# Patient Record
Sex: Female | Born: 1945 | Race: White | Hispanic: No | Smoking: Never smoker
Health system: Southern US, Community
[De-identification: ages and names within clinical notes are randomized; demographics above are authoritative.]

## PROBLEM LIST (undated history)

## (undated) DIAGNOSIS — E785 Hyperlipidemia, unspecified: Secondary | ICD-10-CM

## (undated) DIAGNOSIS — K635 Polyp of colon: Secondary | ICD-10-CM

## (undated) DIAGNOSIS — C801 Malignant (primary) neoplasm, unspecified: Secondary | ICD-10-CM

## (undated) DIAGNOSIS — I10 Essential (primary) hypertension: Secondary | ICD-10-CM

## (undated) DIAGNOSIS — K922 Gastrointestinal hemorrhage, unspecified: Secondary | ICD-10-CM

## (undated) DIAGNOSIS — K2970 Gastritis, unspecified, without bleeding: Secondary | ICD-10-CM

## (undated) DIAGNOSIS — Z5189 Encounter for other specified aftercare: Secondary | ICD-10-CM

## (undated) HISTORY — PX: COLONOSCOPY: SHX174

## (undated) HISTORY — PX: CHOLECYSTECTOMY: SHX55

## (undated) HISTORY — PX: TUBAL LIGATION: SHX77

---

## 2005-02-28 ENCOUNTER — Emergency Department: Payer: Self-pay | Admitting: Emergency Medicine

## 2014-04-22 ENCOUNTER — Inpatient Hospital Stay
Admit: 2014-04-22 | Disposition: A | Discharge status: Home / Self Care | Payer: Self-pay | Attending: Internal Medicine | Admitting: Internal Medicine

## 2014-04-22 LAB — COMPREHENSIVE METABOLIC PANEL
AST: 19 U/L
Albumin: 3.5 g/dL
Alkaline Phosphatase: 60 U/L
Anion Gap: 6 — ABNORMAL LOW (ref 7–16)
BUN: 41 mg/dL — AB
Bilirubin,Total: 0.4 mg/dL
CALCIUM: 8.1 mg/dL — AB
CHLORIDE: 108 mmol/L
CO2: 21 mmol/L — AB
Creatinine: 0.77 mg/dL
EGFR (Non-African Amer.): >60
Glucose: 167 mg/dL — ABNORMAL HIGH
POTASSIUM: 4 mmol/L
SGPT (ALT): 13 U/L — ABNORMAL LOW
SODIUM: 135 mmol/L
TOTAL PROTEIN: 6.4 g/dL — AB

## 2014-04-22 LAB — CBC WITH DIFFERENTIAL/PLATELET
BASOS ABS: 0.1 10*3/uL (ref 0.0–0.1)
Basophil %: 0.4 %
EOS ABS: 0 10*3/uL (ref 0.0–0.7)
Eosinophil %: 0.1 %
HCT: 26.7 % — ABNORMAL LOW (ref 35.0–47.0)
HGB: 8.2 g/dL — ABNORMAL LOW (ref 12.0–16.0)
Lymphocyte #: 0.9 10*3/uL — ABNORMAL LOW (ref 1.0–3.6)
Lymphocyte %: 6.9 %
MCH: 26.4 pg (ref 26.0–34.0)
MCHC: 30.7 g/dL — AB (ref 32.0–36.0)
MCV: 86 fL (ref 80–100)
Monocyte #: 0.5 x10 3/mm (ref 0.2–0.9)
Monocyte %: 3.9 %
NEUTROS ABS: 11.1 10*3/uL — AB (ref 1.4–6.5)
NEUTROS PCT: 88.7 %
Platelet: 333 10*3/uL (ref 150–440)
RBC: 3.11 10*6/uL — ABNORMAL LOW (ref 3.80–5.20)
RDW: 15 % — AB (ref 11.5–14.5)
WBC: 12.6 10*3/uL — ABNORMAL HIGH (ref 3.6–11.0)

## 2014-04-22 LAB — IRON AND TIBC
IRON SATURATION: 45.7
IRON: 206 ug/dL — AB
Iron Bind.Cap.(Total): 450 (ref 250–450)
Unbound Iron-Bind.Cap.: 244.4

## 2014-04-22 LAB — PROTIME-INR
INR: 1
Prothrombin Time: 13 secs

## 2014-04-22 LAB — FERRITIN: Ferritin (ARMC): 4 ng/mL — ABNORMAL LOW

## 2014-04-22 LAB — HEMOGLOBIN: HGB: 7.5 g/dL — AB (ref 12.0–16.0)

## 2014-04-22 LAB — TROPONIN I: Troponin-I: <0.03 ng/mL

## 2014-04-23 LAB — CBC WITH DIFFERENTIAL/PLATELET
Basophil #: 0 10*3/uL (ref 0.0–0.1)
Basophil %: 0.4 %
Eosinophil #: 0 10*3/uL (ref 0.0–0.7)
Eosinophil %: 0.1 %
HCT: 21 % — ABNORMAL LOW (ref 35.0–47.0)
HGB: 6.5 g/dL — ABNORMAL LOW (ref 12.0–16.0)
Lymphocyte #: 2 10*3/uL (ref 1.0–3.6)
Lymphocyte %: 19.7 %
MCH: 27 pg (ref 26.0–34.0)
MCHC: 31.1 g/dL — ABNORMAL LOW (ref 32.0–36.0)
MCV: 87 fL (ref 80–100)
MONOS PCT: 7.2 %
Monocyte #: 0.7 x10 3/mm (ref 0.2–0.9)
Neutrophil #: 7.5 10*3/uL — ABNORMAL HIGH (ref 1.4–6.5)
Neutrophil %: 72.6 %
PLATELETS: 239 10*3/uL (ref 150–440)
RBC: 2.42 10*6/uL — ABNORMAL LOW (ref 3.80–5.20)
RDW: 15 % — ABNORMAL HIGH (ref 11.5–14.5)
WBC: 10.3 10*3/uL (ref 3.6–11.0)

## 2014-04-23 LAB — URINALYSIS, COMPLETE
BILIRUBIN, UR: NEGATIVE
Glucose,UR: NEGATIVE mg/dL (ref 0–75)
Ketone: NEGATIVE
NITRITE: NEGATIVE
PH: 5 (ref 4.5–8.0)
Protein: NEGATIVE
Specific Gravity: 1.02 (ref 1.003–1.030)
WBC UR: 6 /HPF (ref 0–5)

## 2014-04-23 LAB — BASIC METABOLIC PANEL
Anion Gap: 6 — ABNORMAL LOW (ref 7–16)
BUN: 35 mg/dL — AB
CHLORIDE: 113 mmol/L — AB
Calcium, Total: 7.7 mg/dL — ABNORMAL LOW
Co2: 21 mmol/L — ABNORMAL LOW
Creatinine: 0.75 mg/dL
EGFR (African American): >60
GLUCOSE: 112 mg/dL — AB
Potassium: 3.9 mmol/L
SODIUM: 140 mmol/L

## 2014-04-23 LAB — HEMOGLOBIN: HGB: 8.3 g/dL — ABNORMAL LOW (ref 12.0–16.0)

## 2014-04-24 LAB — CBC WITH DIFFERENTIAL/PLATELET
Basophil #: 0 10*3/uL (ref 0.0–0.1)
Basophil %: 0.5 %
EOS PCT: 1 %
Eosinophil #: 0.1 10*3/uL (ref 0.0–0.7)
HCT: 27 % — ABNORMAL LOW (ref 35.0–47.0)
HGB: 8.7 g/dL — AB (ref 12.0–16.0)
Lymphocyte #: 1.3 10*3/uL (ref 1.0–3.6)
Lymphocyte %: 16.8 %
MCH: 27.2 pg (ref 26.0–34.0)
MCHC: 32.2 g/dL (ref 32.0–36.0)
MCV: 84 fL (ref 80–100)
MONO ABS: 0.6 x10 3/mm (ref 0.2–0.9)
MONOS PCT: 7.5 %
Neutrophil #: 5.7 10*3/uL (ref 1.4–6.5)
Neutrophil %: 74.2 %
PLATELETS: 199 10*3/uL (ref 150–440)
RBC: 3.2 10*6/uL — ABNORMAL LOW (ref 3.80–5.20)
RDW: 16.4 % — ABNORMAL HIGH (ref 11.5–14.5)
WBC: 7.7 10*3/uL (ref 3.6–11.0)

## 2014-04-25 LAB — HEMOGLOBIN: HGB: 8 g/dL — ABNORMAL LOW (ref 12.0–16.0)

## 2014-04-26 LAB — HEMOGLOBIN: HGB: 9.1 g/dL — ABNORMAL LOW (ref 12.0–16.0)

## 2014-05-21 LAB — SURGICAL PATHOLOGY

## 2014-05-27 NOTE — Discharge Summary (Signed)
PATIENT NAME:  Veronica Moon, Veronica Moon MR#:  [id number] DATE OF BIRTH:  06/01/1945  DATE OF ADMISSION:  04/22/2014 DATE OF DISCHARGE:  04/28/2014  DISCHARGE DIAGNOSES:  1.  Gastrointestinal bleed.  2.  Anemia of blood loss.  CONDITION ON DISCHARGE: Stable.   CODE STATUS: Full code.    MEDICATIONS ON DISCHARGE:  1.  Alprazolam 0.5 mg oral 3 times a day as needed for anxiety.  2.  Pantoprazole 40 mg delayed-release 2 times a day.  3.  Ferrous sulfate 325 mg oral 3 times a day.   DIET ON DISCHARGE: Regular, diet consistency mechanical soft.   ACTIVITY: As tolerated.   TIMEFRAME TO FOLLOWUP: Within 1-2 weeks in GI clinic. Advised to follow with [facility], Dr. Gaylyn Cheers.   HISTORY OF PRESENTING ILLNESS: This is a 69 year old elderly Caucasian female with no significant past medical history presented to hospital secondary to weakness for 2 days and noticed to have a dark stool in the morning. The patient's daughter did mention that she had another episode of melena about 2 weeks ago and since then she had been doing fine, but this morning she had 2 loose bowel movements both of which were very black-colored. She was not iron supplements. She was not taking any BC Powders. She was taking aspirin and takes Advil once in a while. She had nausea and vomiting 4 times on the day of admission and she also had some loose, black color, tarry colored vomiting. No fever or chills. She was admitted with GI bleed for further management in the hospital.   [place]:  1.  Upper GI bleed: She had coffee ground emesis and melena. Denied alcohol or nonsteroidal anti-inflammatory use. Had blood loss anemia with and 2 units of PRBC were given and given iron tablets on discharge. Protonix IV b.i.d. was also given in the hospital and on discharge given Protonix oral. EGD was done by GI, which showed gastritis. Also, did capsule endoscopy, the results were still pending and she need to follow in  GI clinic for that. Colonoscopy was done, which showed some polyps, but no clear source of bleeding. Hemoglobin was stable after that, so we assumed that her bleeding was because of gastritis and she was advised to follow in GI clinic for further management.  2.  Musculoskeletal right shoulder pain: It resolved. She was tolerating the diet fine.   [place]: GI, Dr. Vira Agar.   IMPORTANT LABORATORY RESULTS: WBC count 12.6, hemoglobin 8.2, platelet count is 333,000, MCV is 86. Troponin less than 0.03. Glucose level is 167, creatinine 0.77, sodium 135, and potassium 4 on admission. Hemoglobin was 7.5 on the March 27, was 6.5 on March 28, and after transfusion on the March 29 it was 8.7 and then 9.1 on March 31.   TOTAL TIME SPENT ON THIS DISCHARGE: 40 minutes.    ____________________________ Ceasar Lund Anselm Jungling, MD vgv:bm D: 05/02/2014 22:18:21 ET T: 05/03/2014 07:25:30 ET JOB#: [id number]  cc: Ceasar Lund. Anselm Jungling, MD, <Dictator> Manya Silvas, MD Primary Care Physician Vaughan Basta MD ELECTRONICALLY SIGNED 05/21/2014 23:29

## 2014-05-27 NOTE — Consult Note (Signed)
PATIENT NAME:  Veronica Moon, GREENING MR#:  [id number] DATE OF BIRTH:  1945-12-07  DATE OF CONSULTATION:  04/23/2014  REFERRING PHYSICIAN:   CONSULTING PHYSICIAN:  Manya Silvas, MD  HISTORY OF PRESENT ILLNESS: The patient is a 69 year old white female who has been severely weak for a couple of days and having dark stools this morning. She had an episode of dark stools a couple of weeks ago, had a couple of loose bowel movements. Both looked like dark blood. She had nausea and threw up 4 times yesterday, came to the hospital and initially her hemoglobin was 8.2 yesterday at 3:00 in the afternoon and it was 6.5 at 3:40 in the morning. She has since gotten 2 units of blood. I was asked to see her for GI bleeding.   She has been feeling severely weak in the last few days. She had some falls trying to get back and forth to the bathroom and would lie on the floor for a while until she got enough energy to get back in bed. She could hardly walk across the floor she was so weak.   REVIEW OF SYSTEMS: No fever. Significant weakness. No blurred vision. No asthma or wheezing. No known heart disease. She does have a history of some chest pain, but she has never seen a doctor about chest pain. She has been nauseated and has hematemesis of dark material and passage of dark material. No dysuria or hematuria. Sometimes she has food that gets stuck in her esophagus, especially meat. She has been told she has a large hiatal hernia. She does overall feel a little better since coming into the hospital but still feels very weak.   PAST SURGICAL HISTORY: Gallbladder removal, tubal ligation.   ALLERGIES: No known drug allergies.   HOME MEDICATIONS: Before admission: Aspirin 81 mg a day, Advil p.r.n., multivitamins 1 a day, B12 one  daily.   SOCIAL HISTORY: Lives with her granddaughter. No alcohol or tobacco use. The patient is not a Jehovah's Witness.   PHYSICAL EXAMINATION: GENERAL: Elderly white female who looks  somewhat pale.  VITAL SIGNS: Blood pressure 120/58, pulse 72, respirations 18, temperature 97.9.  HEENT: Sclerae anicteric. Conjunctivae are slightly pale. The nail beds are pale. Tongue is somewhat pale.  CHEST: Clear in the anterior fields.  HEART: No murmurs or gallops that I could hear.  ABDOMEN: Somewhat obese. No hepatosplenomegaly. No masses. No bruits. No significant tenderness.  SKIN: Warm and dry.  EXTREMITIES: No edema.   LABORATORY DATA: Initial hemoglobin 8.2; 6.5 this morning before 2 units of blood. A repeat is pending. White count 10.3, platelet count 239,000. Liver panel essentially normal, except total protein 6.4. Ferritin 4. Calcium 8.1. Iron saturation 45.7, iron binding capacity 450, CO2 21, bicarb 108, potassium 4, creatinine 0.77, BUN 41. Repeat after some hydration, BUN 35.   ASSESSMENT: She clearly has upper gastrointestinal bleeding. She has unusual labs. Her ferritin was 4 yet her iron saturation was 45.7. She likely has an ulcer of the stomach or duodenum with gastrointestinal bleeding. She could possibly have Morison erosions and a large hiatal hernia. Hemoglobin is pending. I believe she should probably get another unit given how pale she looks. We will wait on the lab draw to make that determination. Plan to do an upper endoscopy tomorrow and continue to transfuse if needed. She is on Protonix a drip. I would recommend to continue this. I will follow with you.   ____________________________ Manya Silvas, MD rte:sb D: 04/23/2014  16:27:29 ET T: 04/23/2014 17:13:16 ET JOB#: [id number]  cc: Manya Silvas, MD, <Dictator> Manya Silvas MD ELECTRONICALLY SIGNED 05/12/2014 16:15

## 2014-05-27 NOTE — Op Note (Signed)
PATIENT NAME:  Veronica Moon, Veronica Moon MR#:  [id number] DATE OF BIRTH:  [birth year]  DATE OF PROCEDURE:  04/24/2014  PROCEDURE:  Upper endoscopy.  PREOPERATIVE INDICATION:  Gastrointestinal bleed.  POSTOPERATIVE DIAGNOSIS:  Hiatal hernia with Lysbeth Galas gastritis from the hiatal hernia.  MEDICATIONS: Given by CRNA with propofol.  DESCRIPTION OF PROCEDURE:  The Olympus scope was advanced under direct vision through the mouth, down the hypopharynx and into the esophagus. No abnormalities were seen. The scope was passed to the distal esophagus and then into the stomach. The pool of mucus was suctioned. Air was put into the stomach.  The scope was passed to the duodenal bulb and the second portion.  The second portion and the bulb were normal. Scope was withdrawn to the stomach. Examination of antrum, body, cardia, and the fundus showed a medium large hiatal hernia with diffuse gastritis in the areas of the hiatal hernia. No active bleeding. No blood. No ulcers, no significant erosions, but there was diffuse gastritis.  It was removed.  Scope was withdrawn. The patient tolerated the procedure well.   PLAN: I am not sure that gastritis would account for her blood loss and her iron deficiency.  Going to order a capsule study for tomorrow and if that is negative consider colonoscopy.   ____________________________ Manya Silvas, MD rte:sp D: 04/24/2014 17:09:00 ET T: 04/24/2014 17:24:14 ET JOB#: [id number]  cc: Manya Silvas, MD, <Dictator> Manya Silvas MD ELECTRONICALLY SIGNED 05/12/2014 16:15

## 2014-05-27 NOTE — Consult Note (Signed)
EGD with gastritis in proximal stomach with Lysbeth Galas gastritis but without erosions.  esophagus and duodenum normal.  Will start clear liquid diet and do a capsule study tomorrow and colonoscopy Friday.  discussed with family.  Electronic Signatures: Manya Silvas (MD)  (Signed on 29-Mar-16 15:45)  Authored  Last Updated: 29-Mar-16 15:45 by Manya Silvas (MD)

## 2014-05-27 NOTE — H&P (Signed)
PATIENT NAME:  Veronica Moon, Veronica Moon MR#:  [id number] DATE OF BIRTH:  09/18/1945  DATE OF ADMISSION:  04/22/2014  ADMITTING PHYSICIAN: Gladstone Lighter, MD  PRIMARY CARE PHYSICIAN: None.  CHIEF COMPLAINT: Weakness and melena.    HISTORY OF PRESENT ILLNESS: Veronica Moon is a 69 year old elderly Caucasian female with no significant past medical history, who presents to the hospital secondary to weakness for 2 days and noticed to have dark stools this morning. The patient's daughter did mention that the patient had another episode of melena about 2 weeks ago. Since then, she has been doing fine, but this morning she had 2 loose bowel movements, both of which were very blackish. The patient is not on any iron supplements. She denies taking any BC Powder. She is on aspirin once a day and she takes Advil once in a while for her pain, but she denies taking it very regularly. She has had nausea and threw up 4 times today. Each time it was like loose, blackish, tarrish vomiting. No fevers or chills. No chest pain or dyspnea.   PAST MEDICAL HISTORY: None.   PAST SURGICAL HISTORY:  1.  Cholecystectomy.  2.  Tubal ligation.   ALLERGIES TO MEDICATIONS: No known drug allergies.   CURRENT HOME MEDICATIONS:  1.  Aspirin 81 mg p.o. daily.  2.  Multivitamin 1 tablet p.o. daily.  3.  B12 one tablet p.o. daily.  4.  Occasional Advil, not regularly.   SOCIAL HISTORY: Lives at home with her granddaughter. Steady on her feet. Independent in her daily activities. Denies any smoking or alcohol use.   FAMILY HISTORY: History of hypertension and heart disease in the family.   REVIEW OF SYSTEMS:  CONSTITUTIONAL: No fever. Positive for fatigue and weakness.  EYES: No blurred vision, double vision, inflammation, or glaucoma.  ENT: No tinnitus, ear pain, hearing loss, epistaxis, or discharge.  RESPIRATORY: No cough, wheeze, hemoptysis, or COPD.  CARDIOVASCULAR: No chest pain, orthopnea, edema, arrhythmia,  palpitations, or syncope.  GASTROINTESTINAL: Positive for nausea. Positive for vomiting. Positive for hematemesis and melena. No diarrhea, abdominal pain, or jaundice.  GENITOURINARY: No dysuria, hematuria, renal calculus, frequency, or incontinence.  ENDOCRINE: No polyuria, nocturia, thyroid problems, heat or cold intolerance.  HEMATOLOGY: No anemia, easy bruising, or bleeding.  SKIN: No acne, rash, or lesions.  MUSCULOSKELETAL: No neck, back, shoulder pain, arthritis, or gout.  NEUROLOGIC: No numbness, weakness, CVA, TIA, or seizures.  PSYCHOLOGICAL: No anxiety, insomnia, or depression.   PHYSICAL EXAMINATION:  VITAL SIGNS: Temperature 99 degrees Fahrenheit, pulse 98, respirations 20, blood pressure 152/60, pulse oximetry 100% on room air.  GENERAL: Heavily-built, well-nourished female lying in bed, not in any acute distress.  HEENT: Normocephalic, atraumatic. Pupils equal, round, reacting to light. Anicteric sclerae. Extraocular movements intact. Oropharynx clear without erythema, mass, or exudates.  NECK: Supple. No thyromegaly, JVD, or carotid bruits. No lymphadenopathy.  LUNGS: Moving air bilaterally. No wheeze or crackles. No use of accessory muscles for breathing.  CARDIOVASCULAR: S1, S2. Regular rate and rhythm. No murmurs, rubs, or gallops.  ABDOMEN: Soft, nontender, nondistended. No hepatosplenomegaly. Normal bowel sounds.  EXTREMITIES: No pedal edema. No clubbing or cyanosis; 2+ dorsalis pedis pulses palpable bilaterally.  SKIN: No acne, rash, or lesions.  LYMPHATICS: No cervical lymphadenopathy.  NEUROLOGIC: Cranial nerves intact. No focal motor or sensory deficits.  PSYCHOLOGICAL: The patient is awake, alert, oriented x 3.   LABORATORY DATA: WBC 12.6, hemoglobin 8.2, hematocrit 26.7, platelet count 333,000.  Sodium 135, potassium 4.0, chloride 108, bicarbonate  21, BUN 35, creatinine 0.77, glucose 167, calcium of 8.1. ALT 13, AST 19, alkaline phosphatase  60, total bilirubin  0.4, albumin of 3.5, and INR is 1.0. Troponin is negative.    Abdominal x-ray showing large hiatal hernia, nonobstructive bowel gas pattern, no acute cardiopulmonary disease.   ASSESSMENT AND PLAN: A 69 year old female with no significant past medical history, admitted for weakness, noted to have anemia and melena. Hemoccult is positive.  1.  Upper gastrointestinal bleed with hematemesis and melena. Denies prior alcohol use, nonsteroidal anti-inflammatory drug use, or BC Powder use. Hold her aspirin. Intravenous Protonix drip has been started. Gastroenterology consult requested. Orthostatics. Type and cross match. Monitor on telemetry. Hemoglobin check every 6 hours; unknown baseline hemoglobin. Vitals are stable for now, so transfuse if hemoglobin is less than 7.  2.  Anemia, likely chronic versus acute on chronic. No recent baseline available. Hemoglobin check every 6 hours. Transfuse if hemoglobin is less than 7. Consent has been signed. Iron studies have been ordered.  3.  Deep vein thrombosis prophylaxis with thromboembolic deterrent stockings and sequential compression devices.   CODE STATUS OF THE PATIENT: FULL CODE.   TIME SPENT ON ADMISSION: 50 minutes.    ____________________________ Gladstone Lighter, MD rk:TT D: 04/22/2014 18:31:32 ET T: 04/22/2014 21:30:02 ET JOB#: [id number]  cc: Gladstone Lighter, MD, <Dictator> Gladstone Lighter MD ELECTRONICALLY SIGNED 05/03/2014 12:32

## 2014-05-27 NOTE — Consult Note (Signed)
Given the studies of the EGD showing gastritis, the capsule showing small bowel without bleeding source and the colonoscopy showing diverticulosis and a colon polyp she most likely bled from the gastritis and would continue PPI treatment and give carafate 34min before meals and follow up with CBC in 1-2 weeks.  Woulld check a celiac panel before discharge.  Electronic Signatures: Manya Silvas (MD)  (Signed on 01-Apr-16 14:33)  Authored  Last Updated: 01-Apr-16 14:33 by Manya Silvas (MD)

## 2015-07-02 ENCOUNTER — Inpatient Hospital Stay
Admission: EM | Admit: 2015-07-02 | Discharge: 2015-07-06 | DRG: 378 | Disposition: A | Discharge status: Home Health | Payer: Medicare Other | Attending: Internal Medicine | Admitting: Internal Medicine

## 2015-07-02 ENCOUNTER — Inpatient Hospital Stay (HOSPITAL_COMMUNITY)
Admit: 2015-07-02 | Discharge: 2015-07-02 | Disposition: A | Discharge status: Home / Self Care | Payer: Medicare Other | Attending: Internal Medicine | Admitting: Internal Medicine

## 2015-07-02 ENCOUNTER — Encounter: Payer: Self-pay | Admitting: Emergency Medicine

## 2015-07-02 ENCOUNTER — Emergency Department: Payer: Medicare Other

## 2015-07-02 DIAGNOSIS — D62 Acute posthemorrhagic anemia: Secondary | ICD-10-CM | POA: Diagnosis present

## 2015-07-02 DIAGNOSIS — Z6836 Body mass index (BMI) 36.0-36.9, adult: Secondary | ICD-10-CM | POA: Diagnosis not present

## 2015-07-02 DIAGNOSIS — Z86010 Personal history of colonic polyps: Secondary | ICD-10-CM | POA: Diagnosis not present

## 2015-07-02 DIAGNOSIS — Z8249 Family history of ischemic heart disease and other diseases of the circulatory system: Secondary | ICD-10-CM | POA: Diagnosis not present

## 2015-07-02 DIAGNOSIS — K922 Gastrointestinal hemorrhage, unspecified: Secondary | ICD-10-CM

## 2015-07-02 DIAGNOSIS — K5900 Constipation, unspecified: Secondary | ICD-10-CM | POA: Diagnosis present

## 2015-07-02 DIAGNOSIS — Z885 Allergy status to narcotic agent status: Secondary | ICD-10-CM

## 2015-07-02 DIAGNOSIS — D500 Iron deficiency anemia secondary to blood loss (chronic): Secondary | ICD-10-CM | POA: Diagnosis present

## 2015-07-02 DIAGNOSIS — D638 Anemia in other chronic diseases classified elsewhere: Secondary | ICD-10-CM | POA: Diagnosis present

## 2015-07-02 DIAGNOSIS — R0602 Shortness of breath: Secondary | ICD-10-CM | POA: Diagnosis present

## 2015-07-02 DIAGNOSIS — I429 Cardiomyopathy, unspecified: Secondary | ICD-10-CM | POA: Diagnosis present

## 2015-07-02 DIAGNOSIS — I10 Essential (primary) hypertension: Secondary | ICD-10-CM | POA: Diagnosis present

## 2015-07-02 DIAGNOSIS — K254 Chronic or unspecified gastric ulcer with hemorrhage: Secondary | ICD-10-CM | POA: Diagnosis present

## 2015-07-02 DIAGNOSIS — K2970 Gastritis, unspecified, without bleeding: Secondary | ICD-10-CM | POA: Diagnosis present

## 2015-07-02 DIAGNOSIS — M549 Dorsalgia, unspecified: Secondary | ICD-10-CM | POA: Diagnosis present

## 2015-07-02 DIAGNOSIS — K259 Gastric ulcer, unspecified as acute or chronic, without hemorrhage or perforation: Secondary | ICD-10-CM | POA: Insufficient documentation

## 2015-07-02 DIAGNOSIS — R0600 Dyspnea, unspecified: Secondary | ICD-10-CM

## 2015-07-02 HISTORY — DX: Encounter for other specified aftercare: Z51.89

## 2015-07-02 HISTORY — DX: Essential (primary) hypertension: I10

## 2015-07-02 HISTORY — DX: Gastritis, unspecified, without bleeding: K29.70

## 2015-07-02 HISTORY — DX: Gastrointestinal hemorrhage, unspecified: K92.2

## 2015-07-02 HISTORY — DX: Polyp of colon: K63.5

## 2015-07-02 LAB — BASIC METABOLIC PANEL
ANION GAP: 8 (ref 5–15)
BUN: 45 mg/dL — ABNORMAL HIGH (ref 6–20)
CALCIUM: 8.8 mg/dL — AB (ref 8.9–10.3)
CO2: 23 mmol/L (ref 22–32)
Chloride: 105 mmol/L (ref 101–111)
Creatinine, Ser: 0.82 mg/dL (ref 0.44–1.00)
GFR calc non Af Amer: >60 mL/min (ref >60)
Glucose, Bld: 145 mg/dL — ABNORMAL HIGH (ref 65–99)
Potassium: 4.3 mmol/L (ref 3.5–5.1)
SODIUM: 136 mmol/L (ref 135–145)

## 2015-07-02 LAB — BRAIN NATRIURETIC PEPTIDE: B Natriuretic Peptide: 35 pg/mL (ref 0.0–100.0)

## 2015-07-02 LAB — TROPONIN I

## 2015-07-02 LAB — CBC
HCT: 32.6 % — ABNORMAL LOW (ref 35.0–47.0)
Hemoglobin: 11 g/dL — ABNORMAL LOW (ref 12.0–16.0)
MCH: 29.6 pg (ref 26.0–34.0)
MCHC: 33.8 g/dL (ref 32.0–36.0)
MCV: 87.8 fL (ref 80.0–100.0)
Platelets: 298 10*3/uL (ref 150–440)
RBC: 3.71 MIL/uL — AB (ref 3.80–5.20)
RDW: 14.2 % (ref 11.5–14.5)
WBC: 8.8 10*3/uL (ref 3.6–11.0)

## 2015-07-02 LAB — PROTIME-INR
INR: 0.97
PROTHROMBIN TIME: 13.1 s (ref 11.4–15.0)

## 2015-07-02 LAB — HEMOGLOBIN: HEMOGLOBIN: 9.3 g/dL — AB (ref 12.0–16.0)

## 2015-07-02 LAB — APTT: APTT: 28 s (ref 24–36)

## 2015-07-02 MED ORDER — ALPRAZOLAM 0.25 MG PO TABS
0.2500 mg | ORAL_TABLET | Freq: Three times a day (TID) | ORAL | Status: DC | PRN
  Administered 2015-07-02 – 2015-07-04 (×2): 0.25 mg via ORAL
  Filled 2015-07-02 (×2): qty 1

## 2015-07-02 MED ORDER — ONDANSETRON HCL 4 MG PO TABS: 4.0000 mg | ORAL_TABLET | Freq: Four times a day (QID) | ORAL | Status: DC | PRN

## 2015-07-02 MED ORDER — SODIUM CHLORIDE 0.9 % IV BOLUS (SEPSIS)
500.0000 mL | Freq: Once | INTRAVENOUS | Status: AC
  Administered 2015-07-02: 500 mL via INTRAVENOUS

## 2015-07-02 MED ORDER — ALBUTEROL SULFATE (2.5 MG/3ML) 0.083% IN NEBU: 2.5000 mg | INHALATION_SOLUTION | RESPIRATORY_TRACT | Status: DC | PRN

## 2015-07-02 MED ORDER — SODIUM CHLORIDE 0.9 % IV SOLN
INTRAVENOUS | Status: DC
  Administered 2015-07-02 – 2015-07-03 (×2): via INTRAVENOUS

## 2015-07-02 MED ORDER — MORPHINE SULFATE (PF) 4 MG/ML IV SOLN
4.0000 mg | INTRAVENOUS | Status: DC | PRN
  Administered 2015-07-03 (×3): 4 mg via INTRAVENOUS
  Filled 2015-07-02 (×3): qty 1

## 2015-07-02 MED ORDER — ACETAMINOPHEN 325 MG PO TABS
650.0000 mg | ORAL_TABLET | Freq: Four times a day (QID) | ORAL | Status: DC | PRN
  Administered 2015-07-03 – 2015-07-05 (×4): 650 mg via ORAL
  Filled 2015-07-02 (×5): qty 2

## 2015-07-02 MED ORDER — ONDANSETRON HCL 4 MG/2ML IJ SOLN
4.0000 mg | Freq: Four times a day (QID) | INTRAMUSCULAR | Status: DC | PRN
  Administered 2015-07-02: 4 mg via INTRAVENOUS
  Filled 2015-07-02: qty 2

## 2015-07-02 MED ORDER — MORPHINE SULFATE (PF) 2 MG/ML IV SOLN
2.0000 mg | INTRAVENOUS | Status: DC | PRN
  Administered 2015-07-02: 2 mg via INTRAVENOUS
  Filled 2015-07-02 (×3): qty 1

## 2015-07-02 MED ORDER — LORAZEPAM 1 MG PO TABS
1.0000 mg | ORAL_TABLET | Freq: Once | ORAL | Status: AC
  Administered 2015-07-02: 1 mg via ORAL

## 2015-07-02 MED ORDER — MORPHINE SULFATE (PF) 2 MG/ML IV SOLN
2.0000 mg | Freq: Once | INTRAVENOUS | Status: AC
  Administered 2015-07-02: 2 mg via INTRAVENOUS

## 2015-07-02 MED ORDER — FAMOTIDINE IN NACL 20-0.9 MG/50ML-% IV SOLN
20.0000 mg | Freq: Two times a day (BID) | INTRAVENOUS | Status: DC
  Administered 2015-07-02 – 2015-07-03 (×2): 20 mg via INTRAVENOUS
  Filled 2015-07-02 (×3): qty 50

## 2015-07-02 MED ORDER — ACETAMINOPHEN 650 MG RE SUPP: 650.0000 mg | Freq: Four times a day (QID) | RECTAL | Status: DC | PRN

## 2015-07-02 MED ORDER — LORAZEPAM 1 MG PO TABS
ORAL_TABLET | ORAL | Status: AC
  Administered 2015-07-02: 1 mg via ORAL
  Filled 2015-07-02: qty 1

## 2015-07-02 MED ORDER — HYDRALAZINE HCL 20 MG/ML IJ SOLN: 10.0000 mg | Freq: Four times a day (QID) | INTRAMUSCULAR | Status: DC | PRN

## 2015-07-02 MED ORDER — METOPROLOL TARTRATE 25 MG PO TABS
12.5000 mg | ORAL_TABLET | Freq: Two times a day (BID) | ORAL | Status: DC
  Administered 2015-07-02 – 2015-07-06 (×7): 12.5 mg via ORAL
  Filled 2015-07-02 (×5): qty 1
  Filled 2015-07-02: qty 2
  Filled 2015-07-02 (×3): qty 1

## 2015-07-02 NOTE — ED Notes (Signed)
Pt called out for feeling worse and states she feels "agitated", family states she has had to take ativan in past during hospital stays, MD notified verbal orders given. See [facility]

## 2015-07-02 NOTE — ED Notes (Addendum)
Pt to ED w/ c/o black tarry stools since 0500 today, pt c/o weakness and some SOB w/ nausea . Pt had hx of GI bleed last year and had to be transfused. Pt denies any CP. Pt A&O. VS stable

## 2015-07-02 NOTE — ED Notes (Signed)
Pt to ed with c/o weakness and sob that started 2 weeks ago,  Reports worse today.  Denies chest pain.

## 2015-07-02 NOTE — ED Provider Notes (Signed)
[facility] Emergency Department Provider Note  ____________________________________________    I have reviewed the triage vital signs and the nursing notes.   HISTORY  Chief Complaint Shortness of Breath    HPI Veronica Moon is a 70 y.o. female who presents with complaints of weakness and shortness of breath over approximately the last week. Patient reports she is also had dark stools for 3-4 days. She reports a history of a GI bleed approximately one year ago and feels this is similar. She denies dizziness or lightheadedness. No chest pain. She becomes short of breath with any exertion at all. No fevers or chills. Mild dry cough for 2 weeks.   History reviewed. No pertinent past medical history.  There are no active problems to display for this patient.   History reviewed. No pertinent past surgical history.  No current outpatient prescriptions on file.  Allergies Demerol  History reviewed. No pertinent family history.  Social History Social History  Substance Use Topics  . Smoking status: Never Smoker   . Smokeless tobacco: None  . Alcohol Use: No    Review of Systems  Constitutional: Negative for fever. Eyes: Negative for redness ENT: Negative for sore throat Cardiovascular: Negative for chest pain Respiratory:As above Gastrointestinal: Negative for abdominal pain, positive for black stools  Genitourinary: Negative for dysuria. Musculoskeletal: Negative for back pain. Skin: Negative for rash. Neurological: Negative for focal weakness Psychiatric: no anxiety    ____________________________________________   PHYSICAL EXAM:  VITAL SIGNS: ED Triage Vitals  Enc Vitals Group     BP 07/02/15 1519 169/76 mmHg     Pulse Rate 07/02/15 1519 94     Resp 07/02/15 1519 18     Temp 07/02/15 1519 98.6 F (37 C)     Temp Source 07/02/15 1519 Oral     SpO2 07/02/15 1519 97 %     Weight 07/02/15 1519 240 lb (108.863 kg)   Height 07/02/15 1519 5\' 8"  (1.727 m)     Head Cir --      Peak Flow --      Pain Score 07/02/15 1519 0     Pain Loc --      Pain Edu? --      Excl. in [place]? --      Constitutional: Alert and oriented. Uncomfortable, ill-appearing Eyes: Conjunctivae are normal. No erythema or injection ENT   Head: Normocephalic and atraumatic.   Mouth/Throat: Mucous membranes are moist. Cardiovascular: Normal rate, regular rhythm. Normal and symmetric distal pulses are present in the upper extremities.  Respiratory: Normal respiratory effort without tachypnea nor retractions. Breath sounds are clear and equal bilaterally.  Gastrointestinal: Soft and non-tender in all quadrants. No distention. There is no CVA tenderness. Guaiac positive black stool Genitourinary: deferred Musculoskeletal: Nontender with normal range of motion in all extremities. No lower extremity tenderness nor edema. Neurologic:  Normal speech and language. No gross focal neurologic deficits are appreciated. Skin:  Skin is warm, dry and intact. No rash noted. Psychiatric: Mood and affect are normal. Patient exhibits appropriate insight and judgment.  ____________________________________________    LABS (pertinent positives/negatives)  Labs Reviewed  BASIC METABOLIC PANEL - Abnormal; Notable for the following:    Glucose, Bld 145 (*)    BUN 45 (*)    Calcium 8.8 (*)    All other components within normal limits  CBC - Abnormal; Notable for the following:    RBC 3.71 (*)    Hemoglobin 11.0 (*)    HCT 32.6 (*)  All other components within normal limits  TROPONIN I  BRAIN NATRIURETIC PEPTIDE  APTT  PROTIME-INR  TYPE AND SCREEN    ____________________________________________   EKG  ED ECG REPORT I, Lavonia Drafts, the attending physician, personally viewed and interpreted this ECG.  Date: 07/02/2015 EKG Time: 3:16 PM Rate: 93 Rhythm: normal sinus rhythm QRS Axis: normal Intervals: normal ST/T Wave  abnormalities: normal Conduction Disturbances: none    ____________________________________________    RADIOLOGY  No acute distress on chest x-ray  ____________________________________________   PROCEDURES  Procedure(s) performed: none  Critical Care performed: none  ____________________________________________   INITIAL IMPRESSION / ASSESSMENT AND PLAN / ED COURSE  Pertinent labs & imaging results that were available during my care of the patient were reviewed by me and considered in my medical decision making (see chart for details).  Patient with black stool, guaiac positive with significant diffuse weakness and shortness of breath with any exertion. Chest x-ray unremarkable. No chest pain. We'll admit to the hospital service for further management  ____________________________________________   FINAL CLINICAL IMPRESSION(S) / ED DIAGNOSES  Final diagnoses:  Gastrointestinal hemorrhage, unspecified gastritis, unspecified gastrointestinal hemorrhage type  SOB (shortness of breath)          Lavonia Drafts, MD 07/02/15 [phone]

## 2015-07-02 NOTE — H&P (Addendum)
[facility] NAME: Veronica Moon    MR#:  [place]:[id number]  DATE OF BIRTH:  1945/08/23  DATE OF ADMISSION:  07/02/2015  PRIMARY CARE PHYSICIAN: No primary care provider on file.   REQUESTING/REFERRING PHYSICIAN: Lavonia Drafts, MD  CHIEF COMPLAINT:   Chief Complaint  Patient presents with  . Shortness of Breath   Melena today. HISTORY OF PRESENT ILLNESS:  Veronica Moon  is a 70 y.o. female with a known history of GI bleeding, gastritis, polyps and hypertension. The patient had 4-5 episodes of melena and diarrhea at 5 AM today. She also complains of shortness breath and anxiety. But she denies any abdominal pain, nausea or vomiting. She denies any chest pain, palpitation, orthopnea or nocturnal dyspnea but has leg edema for the past one month. According to the patient's daughters, the patient has had dyspnea which has been worsening for the past the several months. She has a history of GI bleeding last year. EGD and colonoscopy showed gastritis and polyps. She got 2 units of blood transfusion at that time.  PAST MEDICAL HISTORY:   Past Medical History  Diagnosis Date  . GIB (gastrointestinal bleeding)   . Gastritis   . Colon polyps   . Hypertension   . Blood transfusion without reported diagnosis     PAST SURGICAL HISTORY:   Past Surgical History  Procedure Laterality Date  . Cholecystectomy      SOCIAL HISTORY:   Social History  Substance Use Topics  . Smoking status: Never Smoker   . Smokeless tobacco: Not on file  . Alcohol Use: No    FAMILY HISTORY:   Family History  Problem Relation Age of Onset  . Hypertension Father   . Heart attack Father     DRUG ALLERGIES:   Allergies  Allergen Reactions  . Demerol [Meperidine] Itching    REVIEW OF SYSTEMS:  CONSTITUTIONAL: No fever, Has generalized weakness.  EYES: No blurred or double vision.  EARS, NOSE, AND THROAT: No tinnitus or ear pain.   RESPIRATORY: No cough, but has shortness of breath, no wheezing or hemoptysis.  CARDIOVASCULAR: No chest pain, orthopnea, edema.  GASTROINTESTINAL: No nausea, vomiting, or abdominal pain. It has melena and diarrhea. GENITOURINARY: No dysuria, hematuria.  ENDOCRINE: No polyuria, nocturia,  HEMATOLOGY: No anemia, easy bruising or bleeding SKIN: No rash or lesion. MUSCULOSKELETAL: No joint pain or arthritis.   NEUROLOGIC: No tingling, numbness, weakness.  PSYCHIATRY: Has anxiety but no depression.   MEDICATIONS AT HOME:   Prior to Admission medications   Not on File      VITAL SIGNS:  Blood pressure 151/81, pulse 92, temperature 98.6 F (37 C), temperature source Oral, resp. rate 27, height 5\' 8"  (1.727 m), weight 240 lb (108.863 kg), SpO2 99 %.  PHYSICAL EXAMINATION:  GENERAL:  70 y.o.-year-old patient lying in the bed with no acute distress. Morbidly obese. EYES: Pupils equal, round, reactive to light and accommodation. No scleral icterus. Extraocular muscles intact.  HEENT: Head atraumatic, normocephalic. Oropharynx and nasopharynx clear.  NECK:  Supple, no jugular venous distention. No thyroid enlargement, no tenderness.  LUNGS: Normal breath sounds bilaterally, no wheezing, rales,rhonchi or crepitation. No use of accessory muscles of respiration.  CARDIOVASCULAR: S1, S2 normal. No murmurs, rubs, or gallops.  ABDOMEN: Soft, nontender, nondistended. Bowel sounds present. No organomegaly or mass.  EXTREMITIES: Trace leg edema, no cyanosis, or clubbing.  NEUROLOGIC: Cranial nerves II through XII are intact. Muscle strength 4/5 in all  extremities. Sensation intact. Gait not checked.  PSYCHIATRIC: The patient is alert and oriented x 3.  SKIN: No obvious rash, lesion, or ulcer.   LABORATORY PANEL:   CBC  Recent Labs Lab 07/02/15 1522  WBC 8.8  HGB 11.0*  HCT 32.6*  PLT 298    ------------------------------------------------------------------------------------------------------------------  Chemistries   Recent Labs Lab 07/02/15 1522  NA 136  K 4.3  CL 105  CO2 23  GLUCOSE 145*  BUN 45*  CREATININE 0.82  CALCIUM 8.8*   ------------------------------------------------------------------------------------------------------------------  Cardiac Enzymes  Recent Labs Lab 07/02/15 1522  TROPONINI <0.03   ------------------------------------------------------------------------------------------------------------------  RADIOLOGY:  Dg Chest Portable 1 View  07/02/2015  CLINICAL DATA:  Shortness of breath.  Cough for 2 weeks. EXAM: PORTABLE CHEST 1 VIEW COMPARISON:  04/26/2014 FINDINGS: Patient rotated minimally right on the frontal. Cardiomegaly accentuated by AP portable technique. Atherosclerosis in the transverse aorta. Moderate hiatal hernia. No pleural effusion or pneumothorax. Clear lungs. IMPRESSION: No acute cardiopulmonary disease. Cardiomegaly without congestive failure. Hiatal hernia. Electronically Signed   By: Abigail Miyamoto M.D.   On: 07/02/2015 16:46    EKG:   Orders placed or performed during the hospital encounter of 07/02/15  . EKG 12-Lead  . EKG 12-Lead  . ED EKG  . ED EKG    IMPRESSION AND PLAN:   GI bleeding with melena Keep nothing by mouth except medication, start Pepcid IV twice a day, follow-up hemoglobin every 8 hours and GI consult. Hold aspirin and Aleve.  Anemia of chronic disease. Hemoglobin is 11 now, follow-up hemoglobin every 8 hours.  The dyspnea on exertion and leg edema. Chest x-ray didn't show any pulmonary edema but has cardiomyopathy. I will get echocardiograph to rule out cardiac dysfunction. Nebulizer when necessary.  Hypertension. Start Lopressor, give hydralazine IV when necessary.  Morbid obesity.  All the records are reviewed and case discussed with ED provider. Management plans discussed with  the patient, her 2 daughters and they are in agreement.  CODE STATUS: Full code  TOTAL TIME TAKING CARE OF THIS PATIENT: 57 minutes.    Demetrios Loll M.D on 07/02/2015 at 6:26 PM  Between 7am to 6pm - Pager - [phone]  After 6pm go to [email or web address] - password [email or web address] [facility]  [place] [facility]  Office  [phone]  CC: Primary care physician; No primary care provider on file.

## 2015-07-03 ENCOUNTER — Inpatient Hospital Stay: Payer: Medicare Other | Admitting: Anesthesiology

## 2015-07-03 ENCOUNTER — Encounter
Admission: EM | Disposition: A | Discharge status: Home Health | Payer: Self-pay | Source: Home / Self Care | Attending: Internal Medicine

## 2015-07-03 ENCOUNTER — Inpatient Hospital Stay: Payer: Medicare Other

## 2015-07-03 DIAGNOSIS — K259 Gastric ulcer, unspecified as acute or chronic, without hemorrhage or perforation: Secondary | ICD-10-CM | POA: Insufficient documentation

## 2015-07-03 DIAGNOSIS — K922 Gastrointestinal hemorrhage, unspecified: Secondary | ICD-10-CM

## 2015-07-03 DIAGNOSIS — K253 Acute gastric ulcer without hemorrhage or perforation: Secondary | ICD-10-CM

## 2015-07-03 HISTORY — PX: ESOPHAGOGASTRODUODENOSCOPY (EGD) WITH PROPOFOL: SHX5813

## 2015-07-03 LAB — BASIC METABOLIC PANEL
Anion gap: 6 (ref 5–15)
BUN: 44 mg/dL — AB (ref 6–20)
CHLORIDE: 108 mmol/L (ref 101–111)
CO2: 22 mmol/L (ref 22–32)
CREATININE: 0.82 mg/dL (ref 0.44–1.00)
Calcium: 8 mg/dL — ABNORMAL LOW (ref 8.9–10.3)
GFR calc Af Amer: >60 mL/min (ref >60)
Glucose, Bld: 132 mg/dL — ABNORMAL HIGH (ref 65–99)
Potassium: 4.9 mmol/L (ref 3.5–5.1)
SODIUM: 136 mmol/L (ref 135–145)

## 2015-07-03 LAB — ECHOCARDIOGRAM COMPLETE
Height: 68 in
Weight: 3844.8 oz

## 2015-07-03 LAB — HEMOGLOBIN
HEMOGLOBIN: 8.7 g/dL — AB (ref 12.0–16.0)
HEMOGLOBIN: 8.5 g/dL — AB (ref 12.0–16.0)

## 2015-07-03 SURGERY — ESOPHAGOGASTRODUODENOSCOPY (EGD) WITH PROPOFOL
Anesthesia: General

## 2015-07-03 MED ORDER — SODIUM CHLORIDE 0.9 % IV SOLN
INTRAVENOUS | Status: DC
  Administered 2015-07-03: 1000 mL via INTRAVENOUS

## 2015-07-03 MED ORDER — PROPOFOL 10 MG/ML IV BOLUS
INTRAVENOUS | Status: DC | PRN
  Administered 2015-07-03: 40 mg via INTRAVENOUS

## 2015-07-03 MED ORDER — LIDOCAINE 5 % EX PTCH
1.0000 | MEDICATED_PATCH | CUTANEOUS | Status: DC
  Administered 2015-07-03 – 2015-07-05 (×3): 1 via TRANSDERMAL
  Filled 2015-07-03 (×3): qty 1

## 2015-07-03 MED ORDER — PROPOFOL 500 MG/50ML IV EMUL
INTRAVENOUS | Status: DC | PRN
  Administered 2015-07-03: 200 ug/kg/min via INTRAVENOUS

## 2015-07-03 MED ORDER — MIDAZOLAM HCL 5 MG/5ML IJ SOLN
INTRAMUSCULAR | Status: DC | PRN
  Administered 2015-07-03: 1 mg via INTRAVENOUS

## 2015-07-03 MED ORDER — LIDOCAINE 2% (20 MG/ML) 5 ML SYRINGE
INTRAMUSCULAR | Status: DC | PRN
  Administered 2015-07-03: 20 mg via INTRAVENOUS

## 2015-07-03 MED ORDER — PANTOPRAZOLE SODIUM 40 MG IV SOLR
40.0000 mg | Freq: Two times a day (BID) | INTRAVENOUS | Status: DC
  Administered 2015-07-03 – 2015-07-04 (×2): 40 mg via INTRAVENOUS
  Filled 2015-07-03 (×6): qty 40

## 2015-07-03 MED ORDER — FENTANYL CITRATE (PF) 100 MCG/2ML IJ SOLN
INTRAMUSCULAR | Status: DC | PRN
  Administered 2015-07-03: 50 ug via INTRAVENOUS

## 2015-07-03 MED ORDER — PHENYLEPHRINE HCL 10 MG/ML IJ SOLN
INTRAMUSCULAR | Status: DC | PRN
  Administered 2015-07-03 (×2): 100 ug via INTRAVENOUS

## 2015-07-03 NOTE — Care Management Note (Addendum)
Case Management Note  Patient Details  Name: ZAHAVA QUANT MRN: [id number] Date of Birth: 10-Aug-1945  Subjective/Objective:                   Met with patient who was in significant pain when I rounded. Her two daughters were at her bedside along with her RN. She has Medicare Part A and B will go in effect in July. She has not drug coverage. She has trouble paying for meds. She has a rolling walker that she has needed since this illness. She lives alone and has been independent with mobility. She has no PCP. She would like to become established with [facility] on [address] [phone] . She would like appointment on (any) Wednesday.  Action/Plan: Referral to Medication Management however because she has Medicare I am uncertain if they can help her. Appointment with Tiney [facility] placed on discharge follow up for patient at discharge.   Expected Discharge Date:                  Expected Discharge Plan:     In-House Referral:     Discharge planning Services  CM Consult  Post Acute Care Choice:  Home Health Choice offered to:  Patient  DME Arranged:    DME Agency:     HH Arranged:    [place] Agency:     Status of Service:  In process, will continue to follow  Medicare Important Message Given:    Date Medicare IM Given:    Medicare IM give by:    Date Additional Medicare IM Given:    Additional Medicare Important Message give by:     If discussed at [place] of Stay Meetings, dates discussed:    Additional Comments:  Marshell Garfinkel, RN 07/03/2015, 9:50 AM

## 2015-07-03 NOTE — Transfer of Care (Signed)
Immediate Anesthesia Transfer of Care Note  Patient: Veronica Moon  Procedure(s) Performed: Procedure(s): ESOPHAGOGASTRODUODENOSCOPY (EGD) WITH PROPOFOL (N/A)  Patient Location: PACU and Endoscopy Unit  Anesthesia Type:General  Level of Consciousness: sedated  Airway & Oxygen Therapy: Patient Spontanous Breathing and Patient connected to nasal cannula oxygen  Post-op Assessment: Report given to RN and Post -op Vital signs reviewed and stable  Post vital signs: Reviewed and stable  Last Vitals:  Filed Vitals:   07/03/15 0801 07/03/15 1209  BP: 141/43 157/68  Pulse: 64 76  Temp: 36.7 C 36.7 C  Resp: 18 19    Last Pain:  Filed Vitals:   07/03/15 1210  PainSc: 0-No pain         Complications: No apparent anesthesia complications

## 2015-07-03 NOTE — Care Management (Signed)
Notified by Med Mgt that as long as patient has current prescriptions they will be able to assist her with meds.

## 2015-07-03 NOTE — Progress Notes (Signed)
Offered to sit patient in chair, but patient refused. 2 person max assist to bedside

## 2015-07-03 NOTE — Progress Notes (Signed)
Notified MD of family concerns,  Received verbal order to discontinue fluids r/t swelling.

## 2015-07-03 NOTE — Progress Notes (Signed)
[facility] [facility]                                                                                                                                                                                            Patient Demographics   Veronica Moon, is a 70 y.o. female, DOB - 06-22-1945, [id number]  Admit date - 07/02/2015   Admitting Physician Demetrios Loll, MD  Outpatient Primary MD for the patient is No primary care provider on file.   LOS - 1  Subjective:Patient admitted with melena. No further melena. Patient was complaining of shortness of breath yesterday but now resolved Patient complains of pain in her left shoulder as well as her mid back all the way down. This started after being admitted.     Review of Systems:   CONSTITUTIONAL: No documented fever. No fatigue, weakness. No weight gain, no weight loss.  EYES: No blurry or double vision.  ENT: No tinnitus. No postnasal drip. No redness of the oropharynx.  RESPIRATORY: No cough, no wheeze, no hemoptysis. No dyspnea.  CARDIOVASCULAR: No chest pain. No orthopnea. No palpitations. No syncope.  GASTROINTESTINAL: No nausea, no vomiting or diarrhea. No abdominal pain. Positive melena or hematochezia.  GENITOURINARY: No dysuria or hematuria.  ENDOCRINE: No polyuria or nocturia. No heat or cold intolerance.  HEMATOLOGY: No anemia. No bruising. No bleeding.  INTEGUMENTARY: No rashes. No lesions.  MUSCULOSKELETAL: No arthritis. No swelling. No gout.  NEUROLOGIC: No numbness, tingling, or ataxia. No seizure-type activity.  PSYCHIATRIC: No anxiety. No insomnia. No ADD.    Vitals:   Filed Vitals:   07/02/15 1953 07/03/15 0458 07/03/15 0801 07/03/15 1209  BP: 155/75 107/50 141/43 157/68  Pulse: 97 64 64 76  Temp: 99.6 F (37.6 C) 98 F (36.7 C) 98 F (36.7 C) 98 F (36.7 C)  TempSrc: Oral Oral Oral Oral  Resp: 22  18 19   Height:      Weight: 108.999 kg (240 lb 4.8 oz)     SpO2: 98%  97% 99% 97%    Wt Readings from Last 3 Encounters:  07/02/15 108.999 kg (240 lb 4.8 oz)     Intake/Output Summary (Last 24 hours) at 07/03/15 1244 Last data filed at 07/03/15 0400  Gross per 24 hour  Intake 803.33 ml  Output      0 ml  Net 803.33 ml    Physical Exam:   GENERAL: Pleasant-appearing in no apparent distress.  HEAD, EYES, EARS, NOSE AND THROAT: Atraumatic, normocephalic. Extraocular muscles are intact. Pupils equal and reactive to light. Sclerae anicteric. No conjunctival injection. No oro-pharyngeal erythema.  NECK: Supple. There is no jugular venous distention. No bruits, no lymphadenopathy, no thyromegaly.  HEART: Regular rate and rhythm,. No murmurs, no rubs, no clicks.  LUNGS: Clear to auscultation bilaterally. No rales or rhonchi. No wheezes.  ABDOMEN: Soft, flat, nontender, nondistended. Has good bowel sounds. No hepatosplenomegaly appreciated.  EXTREMITIES: No evidence of any cyanosis, clubbing, or peripheral edema.  +2 pedal and radial pulses bilaterally.  NEUROLOGIC: The patient is alert, awake, and oriented x3 with no focal motor or sensory deficits appreciated bilaterally.  SKIN: Moist and warm with no rashes appreciated.  Psych: Not anxious, depressed LN: No inguinal LN enlargement    Antibiotics   Anti-infectives    None      Medications   Scheduled Meds: . famotidine (PEPCID) IV  20 mg Intravenous Q12H  . metoprolol tartrate  12.5 mg Oral BID   Continuous Infusions:  PRN Meds:.acetaminophen **OR** acetaminophen, albuterol, ALPRAZolam, hydrALAZINE, morphine injection, ondansetron **OR** ondansetron (ZOFRAN) IV   Data Review:   Micro Results No results found for this or any previous visit (from the past 240 hour(s)).  Radiology Reports Dg Thoracic Spine 2 View  07/03/2015  CLINICAL DATA:  Back pain, heard a pop last night EXAM: THORACIC SPINE 2 VIEWS COMPARISON:  04/26/2014 FINDINGS: Four views of thoracic spine submitted. There is  diffuse osteopenia. No acute fracture or subluxation. Alignment and vertebral body heights are preserved. Stable degenerative changes mid and lower thoracic spine. Degenerative changes are noted upper lumbar spine. Mild lower thoracic dextroscoliosis. IMPRESSION: No acute fracture or subluxation. Diffuse osteopenia. Stable degenerative changes mid and lower thoracic spine. Electronically Signed   By: Lahoma Crocker M.D.   On: 07/03/2015 09:23   Dg Chest Portable 1 View  07/02/2015  CLINICAL DATA:  Shortness of breath.  Cough for 2 weeks. EXAM: PORTABLE CHEST 1 VIEW COMPARISON:  04/26/2014 FINDINGS: Patient rotated minimally right on the frontal. Cardiomegaly accentuated by AP portable technique. Atherosclerosis in the transverse aorta. Moderate hiatal hernia. No pleural effusion or pneumothorax. Clear lungs. IMPRESSION: No acute cardiopulmonary disease. Cardiomegaly without congestive failure. Hiatal hernia. Electronically Signed   By: Abigail Miyamoto M.D.   On: 07/02/2015 16:46     CBC  Recent Labs Lab 07/02/15 1522 07/02/15 2109 07/03/15 0456  WBC 8.8  --   --   HGB 11.0* 9.3* 8.7*  HCT 32.6*  --   --   PLT 298  --   --   MCV 87.8  --   --   MCH 29.6  --   --   MCHC 33.8  --   --   RDW 14.2  --   --     Chemistries   Recent Labs Lab 07/02/15 1522 07/03/15 0456  NA 136 136  K 4.3 4.9  CL 105 108  CO2 23 22  GLUCOSE 145* 132*  BUN 45* 44*  CREATININE 0.82 0.82  CALCIUM 8.8* 8.0*   ------------------------------------------------------------------------------------------------------------------ estimated creatinine clearance is 83.7 mL/min (by C-G formula based on Cr of 0.82). ------------------------------------------------------------------------------------------------------------------ No results for input(s): HGBA1C in the last 72 hours. ------------------------------------------------------------------------------------------------------------------ No results for input(s):  CHOL, HDL, LDLCALC, TRIG, CHOLHDL, LDLDIRECT in the last 72 hours. ------------------------------------------------------------------------------------------------------------------ No results for input(s): TSH, T4TOTAL, T3FREE, THYROIDAB in the last 72 hours.  Invalid input(s): FREET3 ------------------------------------------------------------------------------------------------------------------ No results for input(s): VITAMINB12, FOLATE, FERRITIN, TIBC, IRON, RETICCTPCT in the last 72 hours.  Coagulation profile  Recent Labs Lab 07/02/15 1522  INR 0.97    No results for input(s): DDIMER in  the last 72 hours.  Cardiac Enzymes  Recent Labs Lab 07/02/15 1522  TROPONINI <0.03   ------------------------------------------------------------------------------------------------------------------ Invalid input(s): POCBNP    Assessment & Plan  Patient is a 70 year old admitted with melena  1. GI bleeding with melena likely due to upper GI bleed Continue famotidine Hemoglobin has trended down has another hemoglobin pending we'll transfuse below 7 unless actively bleeding    2. Anemia of chronic disease with acute blood loss anemia As above continue to monitor   3. Back pain suspect due to muscle spasm symptom control  4. SOB  chest x-rays negative echocardiogram shows no significant valvular abnormalities or systolic dysfunction needs outpatient pulmonary follow-up   5. Hypertension. Continue hydralzine and metoprlol  bp stable  6. Morbid obesity. Weight loss  scd's for dvt proph     Code Status Orders        Start     Ordered   07/02/15 1954  Full code   Continuous     07/02/15 1953    Code Status History    Date Active Date Inactive Code Status Order ID Comments User Context   This patient has a current code status but no historical code status.           Consults  gi  DVT Prophylaxis    SCDs    Lab Results  Component Value Date   PLT 298  07/02/2015     Time Spent in minutes   78min  Greater than 50% of time spent in care coordination and counseling patient regarding the condition and plan of care.   Dustin Flock M.D on 07/03/2015 at 12:44 PM  Between 7am to 6pm - Pager - [phone]  After 6pm go to [email or web address] - password [email or web address] [facility]   Office  [phone]

## 2015-07-03 NOTE — Consult Note (Signed)
[facility]  [address], [place], [place] [address] Phone: [phone] Fax : [phone]  Consultation  Referring Provider:     No ref. provider found Primary Care Physician:  No primary care provider on file. Primary Gastroenterologist:           Reason for Consultation:     Melena with anemia  Date of Admission:  07/02/2015 Date of Consultation:  07/03/2015         HPI:   Veronica Moon is a 70 y.o. female who was admitted last night with black stools and a hemoglobin of 11. The patient was found to have a hemoglobin of 8.7 this morning. The patient had been having black stools yesterday. The patient also had been started on aspirin a week ago. The patient states she started taking low-dose aspirin by herself. The patient has a history of having a GI bleed with gastritis found last year. The patient also had a colonoscopy at that time she reports to have been negative. There is no report of any abdominal pain although she is suffering from severe back pain at the present time. She has had no further black stools and was kept nothing by mouth last night. The patient then was given a clear liquid diet this afternoon and drank 1 cup of Juice.  Past Medical History  Diagnosis Date  . GIB (gastrointestinal bleeding)   . Gastritis   . Colon polyps   . Hypertension   . Blood transfusion without reported diagnosis     Past Surgical History  Procedure Laterality Date  . Cholecystectomy      Prior to Admission medications   Not on File    Family History  Problem Relation Age of Onset  . Hypertension Father   . Heart attack Father      Social History  Substance Use Topics  . Smoking status: Never Smoker   . Smokeless tobacco: None  . Alcohol Use: No    Allergies as of 07/02/2015 - Review Complete 07/02/2015  Allergen Reaction Noted  . Demerol [meperidine] Itching 07/02/2015    Review of Systems:    All systems reviewed and negative except  where noted in HPI.   Physical Exam:  Vital signs in last 24 hours: Temp:  [98 F (36.7 C)-99.6 F (37.6 C)] 98 F (36.7 C) (06/07 0801) Pulse Rate:  [64-97] 64 (06/07 0801) Resp:  [16-27] 18 (06/07 0801) BP: (107-182)/(43-87) 141/43 mmHg (06/07 0801) SpO2:  [97 %-100 %] 99 % (06/07 0801) Weight:  [240 lb (108.863 kg)-240 lb 4.8 oz (108.999 kg)] 240 lb 4.8 oz (108.999 kg) (06/06 1953) Last BM Date: 07/02/15 General:   Pleasant, cooperative in NAD Head:  Normocephalic and atraumatic. Eyes:   No icterus.   Conjunctiva pink. PERRLA. Ears:  Normal auditory acuity. Neck:  Supple; no masses or thyroidomegaly Lungs: Respirations even and unlabored. Lungs clear to auscultation bilaterally.   No wheezes, crackles, or rhonchi.  Heart:  Regular rate and rhythm;  Without murmur, clicks, rubs or gallops Abdomen:  Soft, nondistended, nontender. Normal bowel sounds. No appreciable masses or hepatomegaly.  No rebound or guarding.  Rectal:  Not performed. Msk:  Symmetrical without gross deformities.   Severe back pain Extremities:  Without edema, cyanosis or clubbing. Neurologic:  Alert and oriented x3;  grossly normal neurologically. Skin:  Intact without significant lesions or rashes. Cervical Nodes:  No significant cervical adenopathy. Psych:  Alert and cooperative. Normal affect.  LAB RESULTS:  Recent Labs  07/02/15 1522 07/02/15 2109 07/03/15 0456  WBC 8.8  --   --   HGB 11.0* 9.3* 8.7*  HCT 32.6*  --   --   PLT 298  --   --    BMET  Recent Labs  07/02/15 1522 07/03/15 0456  NA 136 136  K 4.3 4.9  CL 105 108  CO2 23 22  GLUCOSE 145* 132*  BUN 45* 44*  CREATININE 0.82 0.82  CALCIUM 8.8* 8.0*   LFT No results for input(s): PROT, ALBUMIN, AST, ALT, ALKPHOS, BILITOT, BILIDIR, IBILI in the last 72 hours. PT/INR  Recent Labs  07/02/15 1522  LABPROT 13.1  INR 0.97    STUDIES: Dg Thoracic Spine 2 View  07/03/2015  CLINICAL DATA:  Back pain, heard a pop last night  EXAM: THORACIC SPINE 2 VIEWS COMPARISON:  04/26/2014 FINDINGS: Four views of thoracic spine submitted. There is diffuse osteopenia. No acute fracture or subluxation. Alignment and vertebral body heights are preserved. Stable degenerative changes mid and lower thoracic spine. Degenerative changes are noted upper lumbar spine. Mild lower thoracic dextroscoliosis. IMPRESSION: No acute fracture or subluxation. Diffuse osteopenia. Stable degenerative changes mid and lower thoracic spine. Electronically Signed   By: Lahoma Crocker M.D.   On: 07/03/2015 09:23   Dg Chest Portable 1 View  07/02/2015  CLINICAL DATA:  Shortness of breath.  Cough for 2 weeks. EXAM: PORTABLE CHEST 1 VIEW COMPARISON:  04/26/2014 FINDINGS: Patient rotated minimally right on the frontal. Cardiomegaly accentuated by AP portable technique. Atherosclerosis in the transverse aorta. Moderate hiatal hernia. No pleural effusion or pneumothorax. Clear lungs. IMPRESSION: No acute cardiopulmonary disease. Cardiomegaly without congestive failure. Hiatal hernia. Electronically Signed   By: Abigail Miyamoto M.D.   On: 07/02/2015 16:46      Impression / Plan:   Veronica Moon is a 70 y.o. y/o female with Melena and a drop of her hemoglobin from 11-8.7. The patient likely has an upper GI bleed with her new use of aspirin as a week ago. The patient has been made nothing by mouth and will be set up for an EGD for today. I have discussed risks & benefits which include, but are not limited to, bleeding, infection, perforation & drug reaction.  The patient agrees with this plan & written consent will be obtained.      Thank you for involving me in the care of this patient.      LOS: 1 day   Lucilla Lame, MD  07/03/2015, 11:51 AM   Note: This dictation was prepared with Dragon dictation along with smaller phrase technology. Any transcriptional errors that result from this process are unintentional.

## 2015-07-03 NOTE — Anesthesia Preprocedure Evaluation (Signed)
Anesthesia Evaluation  Patient identified by MRN, date of birth, ID band Patient awake    Reviewed: Allergy & Precautions, H&P , NPO status , Patient's Chart, lab work & pertinent test results, reviewed documented beta blocker date and time   Airway Mallampati: II   Neck ROM: full    Dental  (+) Poor Dentition, Teeth Intact   Pulmonary neg pulmonary ROS,    Pulmonary exam normal        Cardiovascular hypertension, negative cardio ROS Normal cardiovascular exam Rhythm:regular Rate:Normal     Neuro/Psych negative neurological ROS  negative psych ROS   GI/Hepatic negative GI ROS, Neg liver ROS,   Endo/Other  negative endocrine ROS  Renal/GU negative Renal ROS  negative genitourinary   Musculoskeletal   Abdominal   Peds  Hematology negative hematology ROS (+)   Anesthesia Other Findings Past Medical History:   GIB (gastrointestinal bleeding)                              Gastritis                                                    Colon polyps                                                 Hypertension                                                 Blood transfusion without reported diagnosis               Past Surgical History:   CHOLECYSTECTOMY                                             BMI    Body Mass Index   36.54 kg/m 2     Reproductive/Obstetrics                             Anesthesia Physical Anesthesia Plan  ASA: III  Anesthesia Plan: General   Post-op Pain Management:    Induction:   Airway Management Planned:   Additional Equipment:   Intra-op Plan:   Post-operative Plan:   Informed Consent: I have reviewed the patients History and Physical, chart, labs and discussed the procedure including the risks, benefits and alternatives for the proposed anesthesia with the patient or authorized representative who has indicated his/her understanding and acceptance.    Dental Advisory Given  Plan Discussed with: CRNA  Anesthesia Plan Comments:         Anesthesia Quick Evaluation

## 2015-07-03 NOTE — Op Note (Signed)
[facility] Patient Name: Veronica Moon Procedure Date: 07/03/2015 2:14 PM MRN: [id number] Account #: [id number] Date of Birth: 06-29-1945 Admit Type: Inpatient Age: 70 Room: [facility] ENDO ROOM 4 Gender: Female Note Status: Finalized Procedure:            Upper GI endoscopy Indications:          Melena Providers:            Lucilla Lame, MD Referring MD:         No Local Md, MD (Referring MD) Medicines:            Propofol per Anesthesia Complications:        No immediate complications. Procedure:            Pre-Anesthesia Assessment:                       - Prior to the procedure, a History and Physical was                        performed, and patient medications and allergies were                        reviewed. The patient's tolerance of previous                        anesthesia was also reviewed. The risks and benefits of                        the procedure and the sedation options and risks were                        discussed with the patient. All questions were                        answered, and informed consent was obtained. Prior                        Anticoagulants: The patient has taken no previous                        anticoagulant or antiplatelet agents. ASA Grade                        Assessment: II - A patient with mild systemic disease.                        After reviewing the risks and benefits, the patient was                        deemed in satisfactory condition to undergo the                        procedure.                       After obtaining informed consent, the endoscope was                        passed under direct vision. Throughout the procedure,  the patient's blood pressure, pulse, and oxygen                        saturations were monitored continuously. The                        Colonoscope was introduced through the mouth, and                        advanced to the second  part of duodenum. The upper GI                        endoscopy was accomplished without difficulty. The                        patient tolerated the procedure well. Findings:      The examined esophagus was normal.      Many non-bleeding linear gastric ulcers with no stigmata of bleeding       were found in the gastric body.      The examined duodenum was normal. Impression:           - Normal esophagus.                       - Non-bleeding gastric ulcers with no stigmata of                        bleeding.                       - Normal examined duodenum.                       - No specimens collected. Recommendation:       - Use a proton pump inhibitor PO daily. Procedure Code(s):    --- Professional ---                       [phone], Esophagogastroduodenoscopy, flexible, transoral;                        diagnostic, including collection of specimen(s) by                        brushing or washing, when performed (separate procedure) Diagnosis Code(s):    --- Professional ---                       K92.1, Melena (includes Hematochezia)                       K25.9, Gastric ulcer, unspecified as acute or chronic,                        without hemorrhage or perforation CPT copyright 2016 American Medical Association. All rights reserved. The codes documented in this report are preliminary and upon coder review may  be revised to meet current compliance requirements. Lucilla Lame, MD 07/03/2015 2:26:33 PM This report has been signed electronically. Number of Addenda: 0 Note Initiated On: 07/03/2015 2:14 PM      [facility]

## 2015-07-04 ENCOUNTER — Encounter: Payer: Self-pay | Admitting: Gastroenterology

## 2015-07-04 DIAGNOSIS — K922 Gastrointestinal hemorrhage, unspecified: Secondary | ICD-10-CM | POA: Insufficient documentation

## 2015-07-04 LAB — BASIC METABOLIC PANEL
ANION GAP: 4 — AB (ref 5–15)
BUN: 23 mg/dL — ABNORMAL HIGH (ref 6–20)
CALCIUM: 8.2 mg/dL — AB (ref 8.9–10.3)
CO2: 25 mmol/L (ref 22–32)
Chloride: 110 mmol/L (ref 101–111)
Creatinine, Ser: 0.66 mg/dL (ref 0.44–1.00)
Glucose, Bld: 95 mg/dL (ref 65–99)
Potassium: 4.1 mmol/L (ref 3.5–5.1)
Sodium: 139 mmol/L (ref 135–145)

## 2015-07-04 LAB — HEMOGLOBIN: Hemoglobin: 7.9 g/dL — ABNORMAL LOW (ref 12.0–16.0)

## 2015-07-04 LAB — CBC
HEMATOCRIT: 22.5 % — AB (ref 35.0–47.0)
Hemoglobin: 7.5 g/dL — ABNORMAL LOW (ref 12.0–16.0)
MCH: 29.4 pg (ref 26.0–34.0)
MCHC: 33.3 g/dL (ref 32.0–36.0)
MCV: 88.3 fL (ref 80.0–100.0)
Platelets: 185 10*3/uL (ref 150–440)
RBC: 2.54 MIL/uL — ABNORMAL LOW (ref 3.80–5.20)
RDW: 14.4 % (ref 11.5–14.5)
WBC: 5.9 10*3/uL (ref 3.6–11.0)

## 2015-07-04 MED ORDER — DOCUSATE SODIUM 100 MG PO CAPS
100.0000 mg | ORAL_CAPSULE | Freq: Two times a day (BID) | ORAL | Status: DC
  Administered 2015-07-04 – 2015-07-05 (×4): 100 mg via ORAL
  Filled 2015-07-04 (×5): qty 1

## 2015-07-04 MED ORDER — PANTOPRAZOLE SODIUM 40 MG PO TBEC
40.0000 mg | DELAYED_RELEASE_TABLET | Freq: Two times a day (BID) | ORAL | Status: DC
  Administered 2015-07-04 – 2015-07-06 (×4): 40 mg via ORAL
  Filled 2015-07-04 (×5): qty 1

## 2015-07-04 MED ORDER — BISACODYL 5 MG PO TBEC
10.0000 mg | DELAYED_RELEASE_TABLET | Freq: Every day | ORAL | Status: DC | PRN
  Administered 2015-07-04: 10 mg via ORAL
  Filled 2015-07-04 (×2): qty 2

## 2015-07-04 NOTE — Progress Notes (Signed)
[facility]                                                                                                                                                                                            Patient Demographics   Veronica Moon, is a 70 y.o. female, DOB - Aug 25, 1945, [id number]  Admit date - 07/02/2015   Admitting Physician Demetrios Loll, MD  Outpatient Primary MD for the patient is No primary care provider on file.   LOS - 2  Subjective:Patient admitted with melena.   No complaint but has no BM since admission.  Review of Systems:   CONSTITUTIONAL: No documented fever. No fatigue, weakness. No weight gain, no weight loss.  EYES: No blurry or double vision.  ENT: No tinnitus. No postnasal drip. No redness of the oropharynx.  RESPIRATORY: No cough, no wheeze, no hemoptysis. No dyspnea.  CARDIOVASCULAR: No chest pain. No orthopnea. No palpitations. No syncope.  GASTROINTESTINAL: No nausea, no vomiting or diarrhea. No abdominal pain. No melena or hematochezia. Constipation. GENITOURINARY: No dysuria or hematuria.  ENDOCRINE: No polyuria or nocturia. No heat or cold intolerance.  HEMATOLOGY: No anemia. No bruising. No bleeding.  INTEGUMENTARY: No rashes. No lesions.  MUSCULOSKELETAL: No arthritis. No swelling. No gout.  NEUROLOGIC: No numbness, tingling, or ataxia. No seizure-type activity.  PSYCHIATRIC: No anxiety. No insomnia. No ADD.    Vitals:   Filed Vitals:   07/03/15 1531 07/03/15 2051 07/04/15 0432 07/04/15 1408  BP: 119/60 153/63 144/52 129/40  Pulse: 70 78 69 74  Temp: 97.8 F (36.6 C) 98.2 F (36.8 C) 97.7 F (36.5 C) 98.4 F (36.9 C)  TempSrc: Oral Oral Oral Oral  Resp: 18 19 19 18   Height:      Weight:      SpO2: 99% 100% 100% 100%    Wt Readings from Last 3 Encounters:  07/02/15 240 lb 4.8 oz (108.999 kg)     Intake/Output Summary (Last 24 hours) at 07/04/15 1523 Last data filed at 07/04/15  1300  Gross per 24 hour  Intake   1080 ml  Output      0 ml  Net   1080 ml    Physical Exam:   GENERAL: Pleasant-appearing in no apparent distress.  HEAD, EYES, EARS, NOSE AND THROAT: Atraumatic, normocephalic. Extraocular muscles are intact. Pupils equal and reactive to light. Sclerae anicteric. No conjunctival injection. No oro-pharyngeal erythema.  NECK: Supple. There is no jugular venous distention. No bruits, no lymphadenopathy, no thyromegaly.  HEART: Regular rate and rhythm,. No murmurs, no rubs, no clicks.  LUNGS: Clear to auscultation  bilaterally. No rales or rhonchi. No wheezes.  ABDOMEN: Soft, flat, nontender, nondistended. Has good bowel sounds. No hepatosplenomegaly appreciated.  EXTREMITIES: No evidence of any cyanosis, clubbing, or peripheral edema.  +2 pedal and radial pulses bilaterally.  NEUROLOGIC: The patient is alert, awake, and oriented x3 with no focal motor or sensory deficits appreciated bilaterally.  SKIN: Moist and warm with no rashes appreciated.  Psych: Not anxious, depressed LN: No inguinal LN enlargement    Antibiotics   Anti-infectives    None      Medications   Scheduled Meds: . docusate sodium  100 mg Oral BID  . lidocaine  1 patch Transdermal Q24H  . metoprolol tartrate  12.5 mg Oral BID  . pantoprazole (PROTONIX) IV  40 mg Intravenous Q12H   Continuous Infusions:  PRN Meds:.acetaminophen **OR** acetaminophen, albuterol, ALPRAZolam, bisacodyl, hydrALAZINE, morphine injection, ondansetron **OR** ondansetron (ZOFRAN) IV   Data Review:   Micro Results No results found for this or any previous visit (from the past 240 hour(s)).  Radiology Reports Dg Thoracic Spine 2 View  07/03/2015  CLINICAL DATA:  Back pain, heard a pop last night EXAM: THORACIC SPINE 2 VIEWS COMPARISON:  04/26/2014 FINDINGS: Four views of thoracic spine submitted. There is diffuse osteopenia. No acute fracture or subluxation. Alignment and vertebral body heights are  preserved. Stable degenerative changes mid and lower thoracic spine. Degenerative changes are noted upper lumbar spine. Mild lower thoracic dextroscoliosis. IMPRESSION: No acute fracture or subluxation. Diffuse osteopenia. Stable degenerative changes mid and lower thoracic spine. Electronically Signed   By: Lahoma Crocker M.D.   On: 07/03/2015 09:23   Dg Chest Portable 1 View  07/02/2015  CLINICAL DATA:  Shortness of breath.  Cough for 2 weeks. EXAM: PORTABLE CHEST 1 VIEW COMPARISON:  04/26/2014 FINDINGS: Patient rotated minimally right on the frontal. Cardiomegaly accentuated by AP portable technique. Atherosclerosis in the transverse aorta. Moderate hiatal hernia. No pleural effusion or pneumothorax. Clear lungs. IMPRESSION: No acute cardiopulmonary disease. Cardiomegaly without congestive failure. Hiatal hernia. Electronically Signed   By: Abigail Miyamoto M.D.   On: 07/02/2015 16:46     CBC  Recent Labs Lab 07/02/15 1522 07/02/15 2109 07/03/15 0456 07/03/15 1301 07/04/15 0518 07/04/15 1437  WBC 8.8  --   --   --  5.9  --   HGB 11.0* 9.3* 8.7* 8.5* 7.5* 7.9*  HCT 32.6*  --   --   --  22.5*  --   PLT 298  --   --   --  185  --   MCV 87.8  --   --   --  88.3  --   MCH 29.6  --   --   --  29.4  --   MCHC 33.8  --   --   --  33.3  --   RDW 14.2  --   --   --  14.4  --     Chemistries   Recent Labs Lab 07/02/15 1522 07/03/15 0456 07/04/15 0518  NA 136 136 139  K 4.3 4.9 4.1  CL 105 108 110  CO2 23 22 25   GLUCOSE 145* 132* 95  BUN 45* 44* 23*  CREATININE 0.82 0.82 0.66  CALCIUM 8.8* 8.0* 8.2*   ------------------------------------------------------------------------------------------------------------------ estimated creatinine clearance is 85.8 mL/min (by C-G formula based on Cr of 0.66). ------------------------------------------------------------------------------------------------------------------ No results for input(s): HGBA1C in the last 72  hours. ------------------------------------------------------------------------------------------------------------------ No results for input(s): CHOL, HDL, LDLCALC, TRIG, CHOLHDL, LDLDIRECT in the last 72 hours. ------------------------------------------------------------------------------------------------------------------  No results for input(s): TSH, T4TOTAL, T3FREE, THYROIDAB in the last 72 hours.  Invalid input(s): FREET3 ------------------------------------------------------------------------------------------------------------------ No results for input(s): VITAMINB12, FOLATE, FERRITIN, TIBC, IRON, RETICCTPCT in the last 72 hours.  Coagulation profile  Recent Labs Lab 07/02/15 1522  INR 0.97    No results for input(s): DDIMER in the last 72 hours.  Cardiac Enzymes  Recent Labs Lab 07/02/15 1522  TROPONINI <0.03   ------------------------------------------------------------------------------------------------------------------ Invalid input(s): POCBNP    Assessment & Plan  Patient is a 69 year old admitted with melena  1. GI bleeding with melena, due to gastric ulcer On famotidine iv, change to po protonix bid. Advanced diet. Hemoglobin has trended down to 7.5 this am but repeat at 7.9. F/u in am.  2. Anemia of chronic disease with acute blood loss anemia As above continue to monitor  3. Back pain suspect due to muscle spasm symptom control. Improved.  4. SOB  chest x-rays negative echocardiogram shows no significant valvular abnormalities or systolic dysfunction needs outpatient pulmonary follow-up.   5. Hypertension. Continue hydralzine and metoprlol  bp stable  6. Morbid obesity. Weight loss  scd's for dvt proph  Constipation. Colace bid and dulcolax prn.  PT.      Code Status Orders        Start     Ordered   07/02/15 1954  Full code   Continuous     07/02/15 1953    Code Status History    Date Active Date Inactive Code Status Order ID  Comments User Context   This patient has a current code status but no historical code status.           Consults  gi  DVT Prophylaxis    SCDs    Lab Results  Component Value Date   PLT 185 07/04/2015     Time Spent in minutes   35 min  Greater than 50% of time spent in care coordination and counseling patient regarding the condition and plan of care. Discussed with pt and her daughter.  Demetrios Loll M.D on 07/04/2015 at 3:23 PM  Between 7am to 6pm - Pager - [phone]  After 6pm go to [email or web address] - password [email or web address] [facility]   Office  [phone]

## 2015-07-04 NOTE — Evaluation (Signed)
Physical Therapy Evaluation Patient Details Name: Veronica Moon MRN: [id number] DOB: 08/12/1945 Today's Date: 07/04/2015   History of Present Illness  Veronica Moon is a 70 y.o. female with a known history of GI bleeding, gastritis, polyps and hypertension. The patient had 4-5 episodes of melena and diarrhea at 5 AM today. She also complains of shortness breath and anxiety. But she denies any abdominal pain, nausea or vomiting. She denies any chest pain, palpitation, orthopnea or nocturnal dyspnea but has leg edema for the past one month. According to the patient's daughters, the patient has had dyspnea which has been worsening for the past the several months. She has a history of GI bleeding last year. EGD and colonoscopy showed gastritis and polyps. She got 2 units of blood transfusion at that time. No reported falls over the last 12 months  Clinical Impression  Pt demonstrates supervision or independence with all mobility. She is steady and stable in standing and with ambulation. She is able to ambulate safely without an assistive device but is clearly more comfortable with UE support on walker. Pt able to ascend/descend stairs safely. No further PT needs identified at this time. Pt encouraged to ambulate with nursing staff to prevent deconditioning. Will complete order. Please enter new order if status or needs change.     Follow Up Recommendations No PT follow up    Equipment Recommendations  None recommended by PT;Other (comment) (Utilize rolling walker at discharge)    Recommendations for Other Services       Precautions / Restrictions Precautions Precautions: Fall Restrictions Weight Bearing Restrictions: No      Mobility  Bed Mobility Overal bed mobility: Independent             General bed mobility comments: Increased time required due to back pain but able to perform independently  Transfers Overall transfer level: Needs assistance Equipment used:  Rolling walker (2 wheeled) Transfers: Sit to/from Stand Sit to Stand: Supervision         General transfer comment: Pt demonstrates good speed/sequencing with sit to stand transfers  Ambulation/Gait Ambulation/Gait assistance: Supervision Ambulation Distance (Feet): 220 Feet Assistive device: Rolling walker (2 wheeled) Gait Pattern/deviations: Step-through pattern Gait velocity: Decreased but functional for full household ambulation   General Gait Details: Pt demonstrates safe ambulation with rolling walker. Gait speed is decreased but functional for full household ambulation. Able to perform short distance ambulation without assistive device. VSS during ambulation. Cues for close proximity to walker during ambulation. Fatigue monitored. Pt reports ambulation is close to baseline  Stairs Stairs: Yes Stairs assistance: Supervision Stair Management: One rail Right Number of Stairs: 4 General stair comments: Pt able to ascend and descend 4 stairs safely. She does not require cues to safely navigate stairs  Wheelchair Mobility    Modified Rankin (Stroke Patients Only)       Balance Overall balance assessment: Needs assistance Sitting-balance support: No upper extremity supported Sitting balance-Leahy Scale: Normal     Standing balance support: No upper extremity supported Standing balance-Leahy Scale: Good Standing balance comment: Pt able to maintain narrow and wide stance without UE support. Negative Rhomberg. Single leg balance 2-3 seconds                             Pertinent Vitals/Pain Pain Assessment: 0-10 Pain Score: 6  Pain Location: Mid to upper back pain Pain Descriptors / Indicators: Cramping Pain Intervention(s): Monitored during session;Limited activity within patient's tolerance  Home Living Family/patient expects to be discharged to:: Private residence Living Arrangements: Alone Available Help at Discharge: Family Type of Home:  House Home Access: Stairs to enter Entrance Stairs-Rails: Right Entrance Stairs-Number of Steps: 6 Home Layout: One level Home Equipment: Walker - 2 wheels;Grab bars - toilet (no cane, BSC, or wheelchair)      Prior Function Level of Independence: Independent         Comments: Works, drives. Full community ambulator without assistive device     Hand Dominance   Dominant Hand: Right    Extremity/Trunk Assessment   Upper Extremity Assessment: Overall WFL for tasks assessed (Limited shoulder flexion due to back pain)           Lower Extremity Assessment: Overall WFL for tasks assessed         Communication   Communication: No difficulties  Cognition Arousal/Alertness: Awake/alert Behavior During Therapy: WFL for tasks assessed/performed Overall Cognitive Status: Within Functional Limits for tasks assessed                      General Comments      Exercises        Assessment/Plan    PT Assessment Patent does not need any further PT services  PT Diagnosis     PT Problem List    PT Treatment Interventions     PT Goals (Current goals can be found in the Care Plan section) Acute Rehab PT Goals PT Goal Formulation: All assessment and education complete, DC therapy    Frequency     Barriers to discharge        Co-evaluation               End of Session Equipment Utilized During Treatment: Gait belt Activity Tolerance: Patient tolerated treatment well Patient left: in chair;with call bell/phone within reach           Time: 1410-1445 PT Time Calculation (min) (ACUTE ONLY): 35 min   Charges:   PT Evaluation $PT Eval Low Complexity: 1 Procedure PT Treatments $Gait Training: 8-22 mins   PT G Codes:       Lyndel Safe Huprich PT, DPT   Huprich,Jason 07/04/2015, 4:21 PM

## 2015-07-04 NOTE — Care Management Important Message (Signed)
Important Message  Patient Details  Name: Veronica Moon MRN: [id number] Date of Birth: 1945/11/01   Medicare Important Message Given:  Yes    Shelbie Ammons, RN 07/04/2015, 8:11 AM

## 2015-07-04 NOTE — Progress Notes (Signed)
  Veronica Lame, MD [facility]   [address], [place], [place] [address] Phone: [phone] Fax : [phone]   Subjective: The patient underwent an upper endoscopy yesterday for melanotic stools and was found to have ulcerations in the upper body of the stomach. The patient has dropped her hemoglobin but has not had any further sign of any GI bleeding. The upper endoscopy did not show any all blood or fresh blood. As any abdominal pain nausea vomiting fevers or chills at this time.   Objective: Vital signs in last 24 hours: Filed Vitals:   07/03/15 1531 07/03/15 2051 07/04/15 0432 07/04/15 1408  BP: 119/60 153/63 144/52 129/40  Pulse: 70 78 69 74  Temp: 97.8 F (36.6 C) 98.2 F (36.8 C) 97.7 F (36.5 C) 98.4 F (36.9 C)  TempSrc: Oral Oral Oral Oral  Resp: 18 19 19 18   Height:      Weight:      SpO2: 99% 100% 100% 100%   Weight change:   Intake/Output Summary (Last 24 hours) at 07/04/15 1451 Last data filed at 07/04/15 1300  Gross per 24 hour  Intake   1080 ml  Output      0 ml  Net   1080 ml     Exam: Heart:: Regular rate and rhythm Lungs: normal Abdomen: soft, nontender, normal bowel sounds   Lab Results: @LABTEST2 @ Micro Results: No results found for this or any previous visit (from the past 240 hour(s)). Studies/Results: Dg Thoracic Spine 2 View  07/03/2015  CLINICAL DATA:  Back pain, heard a pop last night EXAM: THORACIC SPINE 2 VIEWS COMPARISON:  04/26/2014 FINDINGS: Four views of thoracic spine submitted. There is diffuse osteopenia. No acute fracture or subluxation. Alignment and vertebral body heights are preserved. Stable degenerative changes mid and lower thoracic spine. Degenerative changes are noted upper lumbar spine. Mild lower thoracic dextroscoliosis. IMPRESSION: No acute fracture or subluxation. Diffuse osteopenia. Stable degenerative changes mid and lower thoracic spine. Electronically Signed   By: Lahoma Crocker M.D.   On: 07/03/2015 09:23   Dg  Chest Portable 1 View  07/02/2015  CLINICAL DATA:  Shortness of breath.  Cough for 2 weeks. EXAM: PORTABLE CHEST 1 VIEW COMPARISON:  04/26/2014 FINDINGS: Patient rotated minimally right on the frontal. Cardiomegaly accentuated by AP portable technique. Atherosclerosis in the transverse aorta. Moderate hiatal hernia. No pleural effusion or pneumothorax. Clear lungs. IMPRESSION: No acute cardiopulmonary disease. Cardiomegaly without congestive failure. Hiatal hernia. Electronically Signed   By: Abigail Miyamoto M.D.   On: 07/02/2015 16:46   Medications: I have reviewed the patient's current medications. Scheduled Meds: . docusate sodium  100 mg Oral BID  . lidocaine  1 patch Transdermal Q24H  . metoprolol tartrate  12.5 mg Oral BID  . pantoprazole (PROTONIX) IV  40 mg Intravenous Q12H   Continuous Infusions:  PRN Meds:.acetaminophen **OR** acetaminophen, albuterol, ALPRAZolam, bisacodyl, hydrALAZINE, morphine injection, ondansetron **OR** ondansetron (ZOFRAN) IV   Assessment: Principal Problem:   GIB (gastrointestinal bleeding) Active Problems:   Peptic ulcer of stomach    Plan: Status post upper GI bleed with melanotic stools and anemia. The patient's hemoglobin has dropped since yesterday but is likely due to hydration and equilibration. The patient has had no further bleeding and had no sign of any old blood or new blood in the stomach. Agree with checking the patient's hemoglobin tomorrow and assess at that point.   LOS: 2 days   Veronica Moon 07/04/2015, 2:51 PM

## 2015-07-05 LAB — PREPARE RBC (CROSSMATCH)

## 2015-07-05 LAB — HEMOGLOBIN: HEMOGLOBIN: 7.1 g/dL — AB (ref 12.0–16.0)

## 2015-07-05 LAB — ABO/RH: ABO/RH(D): A POS

## 2015-07-05 MED ORDER — SODIUM CHLORIDE 0.9 % IV SOLN
Freq: Once | INTRAVENOUS | Status: AC
  Administered 2015-07-05: 09:00:00 via INTRAVENOUS

## 2015-07-05 MED ORDER — ACETAMINOPHEN 325 MG PO TABS
650.0000 mg | ORAL_TABLET | Freq: Once | ORAL | Status: AC
  Administered 2015-07-05: 650 mg via ORAL
  Filled 2015-07-05: qty 2

## 2015-07-05 MED ORDER — METOPROLOL TARTRATE 25 MG PO TABS: 12.5000 mg | ORAL_TABLET | Freq: Two times a day (BID) | ORAL | Status: DC

## 2015-07-05 MED ORDER — LIDOCAINE 5 % EX PTCH: 1.0000 | MEDICATED_PATCH | CUTANEOUS | Status: DC

## 2015-07-05 MED ORDER — PANTOPRAZOLE SODIUM 40 MG PO TBEC: 40.0000 mg | DELAYED_RELEASE_TABLET | Freq: Two times a day (BID) | ORAL | Status: DC

## 2015-07-05 MED ORDER — SODIUM CHLORIDE 0.9 % IV SOLN
Freq: Once | INTRAVENOUS | Status: AC
  Administered 2015-07-05: 21:00:00 via INTRAVENOUS

## 2015-07-05 NOTE — Progress Notes (Signed)
Patient showing no reactions at this time from blood trans. Will continue to monitor. Daughter inquired if MD would prescribe something for anxiety. Notified MD of request, no new orders at this time.

## 2015-07-05 NOTE — Progress Notes (Addendum)
Patient's IV not patent at the beginning of blood admin. Flushed this am with 57ml ns, flushed without issue. Supervisor called to start new line.

## 2015-07-05 NOTE — Progress Notes (Signed)
[place] at [facility]                                                                                                                                                                                            Patient Demographics   Veronica Moon, is a 70 y.o. female, DOB - [birth year], HA:[id number]  Admit date - 07/02/2015   Admitting Physician Demetrios Loll, MD  Outpatient Primary MD for the patient is No primary care provider on file.   LOS - 3  Subjective:Patient admitted with melena.   Had melena last night, possible due to residual stool. But Hb down to 7.1. Back pain.  Review of Systems:   CONSTITUTIONAL: No documented fever. No fatigue, weakness. No weight gain, no weight loss.  EYES: No blurry or double vision.  ENT: No tinnitus. No postnasal drip. No redness of the oropharynx.  RESPIRATORY: No cough, no wheeze, no hemoptysis. No dyspnea.  CARDIOVASCULAR: No chest pain. No orthopnea. No palpitations. No syncope.  GASTROINTESTINAL: No nausea, no vomiting or diarrhea. No abdominal pain. had melena, no  hematochezia. GENITOURINARY: No dysuria or hematuria.  ENDOCRINE: No polyuria or nocturia. No heat or cold intolerance.  HEMATOLOGY: No anemia. No bruising. No bleeding.  INTEGUMENTARY: No rashes. No lesions.  MUSCULOSKELETAL: No arthritis. No swelling. No gout.  NEUROLOGIC: No numbness, tingling, or ataxia. No seizure-type activity.  PSYCHIATRIC: No anxiety. No insomnia. No ADD.    Vitals:   Filed Vitals:   07/05/15 1250 07/05/15 1322 07/05/15 1528 07/05/15 1544  BP: 144/44 132/43 153/53 156/62  Pulse: 63 68 64 64  Temp: 98 F (36.7 C) 98 F (36.7 C) 98.3 F (36.8 C) 98.5 F (36.9 C)  TempSrc: Oral Oral Oral Oral  Resp: 18 18 18 18   Height:      Weight:      SpO2: 100% 100% 99% 100%    Wt Readings from Last 3 Encounters:  07/02/15 240 lb 4.8 oz (108.999 kg)     Intake/Output Summary (Last 24 hours) at 07/05/15  1643 Last data filed at 07/05/15 1545  Gross per 24 hour  Intake   1278 ml  Output      0 ml  Net   1278 ml    Physical Exam:   GENERAL: Pleasant-appearing in no apparent distress.  Obese. HEAD, EYES, EARS, NOSE AND THROAT: Atraumatic, normocephalic. Extraocular muscles are intact. Pupils equal and reactive to light. Sclerae anicteric. No conjunctival injection. No oro-pharyngeal erythema.  NECK: Supple. There is no jugular venous distention. No bruits, no lymphadenopathy, no thyromegaly.  HEART: Regular rate and rhythm,. No murmurs,  no rubs, no clicks.  LUNGS: Clear to auscultation bilaterally. No rales or rhonchi. No wheezes.  ABDOMEN: Soft, flat, nontender, nondistended. Has good bowel sounds. No hepatosplenomegaly appreciated.  EXTREMITIES: No evidence of any cyanosis, clubbing, or peripheral edema.  +2 pedal and radial pulses bilaterally.  NEUROLOGIC: The patient is alert, awake, and oriented x3 with no focal motor or sensory deficits appreciated bilaterally.  SKIN: Moist and warm with no rashes appreciated.  Psych: Not anxious, depressed LN: No inguinal LN enlargement    Antibiotics   Anti-infectives    None      Medications   Scheduled Meds: . docusate sodium  100 mg Oral BID  . lidocaine  1 patch Transdermal Q24H  . metoprolol tartrate  12.5 mg Oral BID  . pantoprazole  40 mg Oral BID AC   Continuous Infusions:  PRN Meds:.acetaminophen **OR** acetaminophen, albuterol, ALPRAZolam, bisacodyl, hydrALAZINE, morphine injection, ondansetron **OR** ondansetron (ZOFRAN) IV   Data Review:   Micro Results No results found for this or any previous visit (from the past 240 hour(s)).  Radiology Reports Dg Thoracic Spine 2 View  07/03/2015  CLINICAL DATA:  Back pain, heard a pop last night EXAM: THORACIC SPINE 2 VIEWS COMPARISON:  04/26/2014 FINDINGS: Four views of thoracic spine submitted. There is diffuse osteopenia. No acute fracture or subluxation. Alignment and  vertebral body heights are preserved. Stable degenerative changes mid and lower thoracic spine. Degenerative changes are noted upper lumbar spine. Mild lower thoracic dextroscoliosis. IMPRESSION: No acute fracture or subluxation. Diffuse osteopenia. Stable degenerative changes mid and lower thoracic spine. Electronically Signed   By: Lahoma Crocker M.D.   On: 07/03/2015 09:23   Dg Chest Portable 1 View  07/02/2015  CLINICAL DATA:  Shortness of breath.  Cough for 2 weeks. EXAM: PORTABLE CHEST 1 VIEW COMPARISON:  04/26/2014 FINDINGS: Patient rotated minimally right on the frontal. Cardiomegaly accentuated by AP portable technique. Atherosclerosis in the transverse aorta. Moderate hiatal hernia. No pleural effusion or pneumothorax. Clear lungs. IMPRESSION: No acute cardiopulmonary disease. Cardiomegaly without congestive failure. Hiatal hernia. Electronically Signed   By: Abigail Miyamoto M.D.   On: 07/02/2015 16:46     CBC  Recent Labs Lab 07/02/15 1522  07/03/15 0456 07/03/15 1301 07/04/15 0518 07/04/15 1437 07/05/15 0506  WBC 8.8  --   --   --  5.9  --   --   HGB 11.0*  < > 8.7* 8.5* 7.5* 7.9* 7.1*  HCT 32.6*  --   --   --  22.5*  --   --   PLT 298  --   --   --  185  --   --   MCV 87.8  --   --   --  88.3  --   --   MCH 29.6  --   --   --  29.4  --   --   MCHC 33.8  --   --   --  33.3  --   --   RDW 14.2  --   --   --  14.4  --   --   < > = values in this interval not displayed.  Chemistries   Recent Labs Lab 07/02/15 1522 07/03/15 0456 07/04/15 0518  NA 136 136 139  K 4.3 4.9 4.1  CL 105 108 110  CO2 23 22 25   GLUCOSE 145* 132* 95  BUN 45* 44* 23*  CREATININE 0.82 0.82 0.66  CALCIUM 8.8* 8.0* 8.2*   ------------------------------------------------------------------------------------------------------------------ estimated  creatinine clearance is 85.8 mL/min (by C-G formula based on Cr of  0.66). ------------------------------------------------------------------------------------------------------------------ No results for input(s): HGBA1C in the last 72 hours. ------------------------------------------------------------------------------------------------------------------ No results for input(s): CHOL, HDL, LDLCALC, TRIG, CHOLHDL, LDLDIRECT in the last 72 hours. ------------------------------------------------------------------------------------------------------------------ No results for input(s): TSH, T4TOTAL, T3FREE, THYROIDAB in the last 72 hours.  Invalid input(s): FREET3 ------------------------------------------------------------------------------------------------------------------ No results for input(s): VITAMINB12, FOLATE, FERRITIN, TIBC, IRON, RETICCTPCT in the last 72 hours.  Coagulation profile  Recent Labs Lab 07/02/15 1522  INR 0.97    No results for input(s): DDIMER in the last 72 hours.  Cardiac Enzymes  Recent Labs Lab 07/02/15 1522  TROPONINI <0.03   ------------------------------------------------------------------------------------------------------------------ Invalid input(s): POCBNP    Assessment & Plan  Patient is a 70 year old admitted with melena  1. GI bleeding with melena, due to gastric ulcer On famotidine iv, change to po protonix bid. Advanced diet. Hemoglobin has trended down to 7.1 this am, getting 2 units PRBC transfusion, F/u in am.  2. Anemia of chronic disease with acute blood loss anemia As above continue to monitor  3. Back pain suspect due to muscle spasm symptom control. Pain control.  4. SOB, improved. Possible due to anemia.  chest x-rays negative echocardiogram shows no significant valvular abnormalities or systolic dysfunction needs outpatient pulmonary follow-up.   5. Hypertension. Continue hydralzine and metoprlol  bp stable  6. Morbid obesity. Weight loss  scd's for dvt proph  Constipation.  Colace bid and dulcolax prn. Improved.      Code Status Orders        Start     Ordered   07/02/15 1954  Full code   Continuous     07/02/15 1953    Code Status History    Date Active Date Inactive Code Status Order ID Comments User Context   This patient has a current code status but no historical code status.           Consults  gi  DVT Prophylaxis    SCDs    Lab Results  Component Value Date   PLT 185 07/04/2015     Time Spent in minutes   38 min  Greater than 50% of time spent in care coordination and counseling patient regarding the condition and plan of care. Discussed with pt and her daughter.  Demetrios Loll M.D on 07/05/2015 at 4:43 PM  Between 7am to 6pm - Pager - [phone]  After 6pm go to [email or web address] - password [email or web address] [facility]   Office  [phone]

## 2015-07-06 LAB — TYPE AND SCREEN
ABO/RH(D): A POS
ABO/RH(D): A POS
ANTIBODY SCREEN: NEGATIVE
Antibody Screen: NEGATIVE
UNIT DIVISION: 0
Unit division: 0
Unit division: 0

## 2015-07-06 LAB — HEMOGLOBIN: Hemoglobin: 8.8 g/dL — ABNORMAL LOW (ref 12.0–16.0)

## 2015-07-06 NOTE — Discharge Instructions (Signed)
Heart healthy diet. Avoid NSAIS.

## 2015-07-08 NOTE — Discharge Summary (Signed)
[place] at [place] NAME: Veronica Moon    MR#:  [id number]  DATE OF BIRTH:  May 23, 1945  DATE OF ADMISSION:  07/02/2015 ADMITTING PHYSICIAN: Demetrios Loll, MD  DATE OF DISCHARGE: 07/06/2015 10:57 AM  PRIMARY CARE PHYSICIAN: No primary care provider on file.    ADMISSION DIAGNOSIS:  SOB (shortness of breath) [R06.02] Gastrointestinal hemorrhage, unspecified gastritis, unspecified gastrointestinal hemorrhage type [K92.2]   DISCHARGE DIAGNOSIS:   GI bleeding with melena, due to gastric ulcer Anemia. SECONDARY DIAGNOSIS:   Past Medical History  Diagnosis Date  . GIB (gastrointestinal bleeding)   . Gastritis   . Colon polyps   . Hypertension   . Blood transfusion without reported diagnosis     HOSPITAL COURSE:   Patient is a 70 year old admitted with melena  1. GI bleeding with melena, due to gastric ulcer She was on famotidine iv, changed to po protonix bid. Advanced diet. She got 2 units PRBC transfusion. Hb increased from 7.1 to 8.8.  2. Anemia of chronic disease with acute blood loss anemia As above continue to monitor  3. Back pain suspect due to muscle spasm symptom control. Pain control.  4. SOB, improved. Possible due to anemia. chest x-rays negative echocardiogram shows no significant valvular abnormalities or systolic dysfunction needs outpatient pulmonary follow-up.   5. Hypertension. Continue hydralzine and metoprlol bp stable  6. Morbid obesity. Weight loss  scd's for dvt proph  Constipation. Colace bid and dulcolax prn. Improved.  DISCHARGE CONDITIONS:   Stable, discharged to home.  CONSULTS OBTAINED:  Treatment Team:  Lucilla Lame, MD  DRUG ALLERGIES:   Allergies  Allergen Reactions  . Demerol [Meperidine] Itching    DISCHARGE MEDICATIONS:   Discharge Medication List as of 07/06/2015 10:18 AM    START taking these medications   Details  lidocaine (LIDODERM) 5 % Place 1 patch onto  the skin daily. Remove & Discard patch within 12 hours or as directed by MD, Starting 07/05/2015, Until Discontinued, Print    metoprolol tartrate (LOPRESSOR) 25 MG tablet Take 0.5 tablets (12.5 mg total) by mouth 2 (two) times daily., Starting 07/05/2015, Until Discontinued, Print    pantoprazole (PROTONIX) 40 MG tablet Take 1 tablet (40 mg total) by mouth 2 (two) times daily before a meal., Starting 07/05/2015, Until Discontinued, Print         DISCHARGE INSTRUCTIONS:   If you experience worsening of your admission symptoms, develop shortness of breath, life threatening emergency, suicidal or homicidal thoughts you must seek medical attention immediately by calling 911 or calling your MD immediately  if symptoms less severe.  You Must read complete instructions/literature along with all the possible adverse reactions/side effects for all the Medicines you take and that have been prescribed to you. Take any new Medicines after you have completely understood and accept all the possible adverse reactions/side effects.   Please note  You were cared for by a hospitalist during your hospital stay. If you have any questions about your discharge medications or the care you received while you were in the hospital after you are discharged, you can call the unit and asked to speak with the hospitalist on call if the hospitalist that took care of you is not available. Once you are discharged, your primary care physician will handle any further medical issues. Please note that NO REFILLS for any discharge medications will be authorized once you are discharged, as it is imperative that you return to your primary care physician (  or establish a relationship with a primary care physician if you do not have one) for your aftercare needs so that they can reassess your need for medications and monitor your lab values.    Today   SUBJECTIVE    No complaint.  VITAL SIGNS:  Blood pressure 159/62, pulse 69,  temperature 98.6 F (37 C), temperature source Oral, resp. rate 18, height 5\' 8"  (1.727 m), weight 240 lb 4.8 oz (108.999 kg), SpO2 100 %.  I/O:  No intake or output data in the 24 hours ending 07/08/15 1643  PHYSICAL EXAMINATION:  GENERAL:  70 y.o.-year-old patient lying in the bed with no acute distress.  EYES: Pupils equal, round, reactive to light and accommodation. No scleral icterus. Extraocular muscles intact.  HEENT: Head atraumatic, normocephalic. Oropharynx and nasopharynx clear.  NECK:  Supple, no jugular venous distention. No thyroid enlargement, no tenderness.  LUNGS: Normal breath sounds bilaterally, no wheezing, rales,rhonchi or crepitation. No use of accessory muscles of respiration.  CARDIOVASCULAR: S1, S2 normal. No murmurs, rubs, or gallops.  ABDOMEN: Soft, non-tender, non-distended. Bowel sounds present. No organomegaly or mass.  EXTREMITIES: No pedal edema, cyanosis, or clubbing.  NEUROLOGIC: Cranial nerves II through XII are intact. Muscle strength 5/5 in all extremities. Sensation intact. Gait not checked.  PSYCHIATRIC: The patient is alert and oriented x 3.  SKIN: No obvious rash, lesion, or ulcer.   DATA REVIEW:   CBC  Recent Labs Lab 07/04/15 0518  07/06/15 0249  WBC 5.9  --   --   HGB 7.5*  < > 8.8*  HCT 22.5*  --   --   PLT 185  --   --   < > = values in this interval not displayed.  Chemistries   Recent Labs Lab 07/04/15 0518  NA 139  K 4.1  CL 110  CO2 25  GLUCOSE 95  BUN 23*  CREATININE 0.66  CALCIUM 8.2*    Cardiac Enzymes  Recent Labs Lab 07/02/15 1522  TROPONINI <0.03    Microbiology Results  No results found for this or any previous visit.  RADIOLOGY:  No results found.      Management plans discussed with the patient, family and they are in agreement.  CODE STATUS:  Code Status History    Date Active Date Inactive Code Status Order ID Comments User Context   07/02/2015  7:53 PM 07/06/2015  1:58 PM Full Code  [id number]  Demetrios Loll, MD Inpatient      TOTAL TIME TAKING CARE OF THIS PATIENT: 35 minutes.    Demetrios Loll M.D on 07/08/2015 at 4:43 PM  Between 7am to 6pm - Pager - [phone]  After 6pm go to [email or web address] - password [email or web address]  [facility]  Office  [phone]  CC: Primary care physician; No primary care provider on file.

## 2015-07-11 ENCOUNTER — Ambulatory Visit: Payer: Medicare Other | Admitting: Family Medicine

## 2015-07-16 NOTE — Anesthesia Postprocedure Evaluation (Signed)
Anesthesia Post Note  Patient: Veronica Moon  Procedure(s) Performed: Procedure(s) (LRB): ESOPHAGOGASTRODUODENOSCOPY (EGD) WITH PROPOFOL (N/A)  Patient location during evaluation: PACU Anesthesia Type: General Level of consciousness: awake and alert Pain management: pain level controlled Vital Signs Assessment: post-procedure vital signs reviewed and stable Respiratory status: spontaneous breathing, nonlabored ventilation, respiratory function stable and patient connected to nasal cannula oxygen Cardiovascular status: blood pressure returned to baseline and stable Postop Assessment: no signs of nausea or vomiting Anesthetic complications: no    Last Vitals:  Filed Vitals:   07/06/15 0556 07/06/15 0715  BP: 160/80 159/62  Pulse:  69  Temp:  37 C  Resp:  18    Last Pain:  Filed Vitals:   07/06/15 0715  PainSc: [place] Sary Bogie

## 2015-07-17 ENCOUNTER — Ambulatory Visit: Payer: Medicare Other | Admitting: Family Medicine

## 2015-07-17 ENCOUNTER — Ambulatory Visit (INDEPENDENT_AMBULATORY_CARE_PROVIDER_SITE_OTHER): Payer: Self-pay | Admitting: Gastroenterology

## 2015-07-17 ENCOUNTER — Other Ambulatory Visit
Admission: RE | Admit: 2015-07-17 | Discharge: 2015-07-17 | Disposition: A | Discharge status: Home / Self Care | Payer: Medicare Other | Source: Ambulatory Visit | Attending: Gastroenterology | Admitting: Gastroenterology

## 2015-07-17 ENCOUNTER — Other Ambulatory Visit: Payer: Self-pay

## 2015-07-17 ENCOUNTER — Encounter: Payer: Self-pay | Admitting: Gastroenterology

## 2015-07-17 VITALS — BP 159/76 | HR 72 | Temp 98.6°F | Ht 67.0 in | Wt 240.0 lb

## 2015-07-17 DIAGNOSIS — R1084 Generalized abdominal pain: Secondary | ICD-10-CM | POA: Insufficient documentation

## 2015-07-17 DIAGNOSIS — D649 Anemia, unspecified: Secondary | ICD-10-CM | POA: Insufficient documentation

## 2015-07-17 LAB — CBC WITH DIFFERENTIAL/PLATELET
BASOS PCT: 1 %
Basophils Absolute: 0.1 10*3/uL (ref 0–0.1)
Eosinophils Absolute: 0.1 10*3/uL (ref 0–0.7)
Eosinophils Relative: 2 %
HEMATOCRIT: 31.7 % — AB (ref 35.0–47.0)
Hemoglobin: 10.4 g/dL — ABNORMAL LOW (ref 12.0–16.0)
LYMPHS ABS: 1.1 10*3/uL (ref 1.0–3.6)
Lymphocytes Relative: 17 %
MCH: 28.6 pg (ref 26.0–34.0)
MCHC: 32.9 g/dL (ref 32.0–36.0)
MCV: 86.9 fL (ref 80.0–100.0)
MONO ABS: 0.6 10*3/uL (ref 0.2–0.9)
MONOS PCT: 9 %
NEUTROS ABS: 4.4 10*3/uL (ref 1.4–6.5)
Neutrophils Relative %: 71 %
Platelets: 332 10*3/uL (ref 150–440)
RBC: 3.64 MIL/uL — ABNORMAL LOW (ref 3.80–5.20)
RDW: 14.4 % (ref 11.5–14.5)
WBC: 6.3 10*3/uL (ref 3.6–11.0)

## 2015-07-17 LAB — FERRITIN: Ferritin: 5 ng/mL — ABNORMAL LOW (ref 11–307)

## 2015-07-17 LAB — IRON AND TIBC
Iron: 21 ug/dL — ABNORMAL LOW (ref 28–170)
Saturation Ratios: 5 % — ABNORMAL LOW (ref 10.4–31.8)
TIBC: 394 ug/dL (ref 250–450)
UIBC: 373 ug/dL

## 2015-07-17 MED ORDER — PANTOPRAZOLE SODIUM 40 MG PO TBEC: 40.0000 mg | DELAYED_RELEASE_TABLET | Freq: Two times a day (BID) | ORAL | Status: DC

## 2015-07-17 NOTE — Progress Notes (Signed)
Primary Care Physician: No PCP Per Patient  Primary Gastroenterologist:  Dr. Lucilla Lame  Chief Complaint  Patient presents with  . Hospitalization Follow-up    HPI: Veronica Moon is a 70 y.o. female here For follow-up after being in the hospital with gastric ulcers. The patient was on NSAIDs at that time. The patient states she is feeling much better without any black stools or bloody stools. The patient has not had her hemoglobin checked since being discharged but does report that she has been feeling weak mostly in the morning. The patient comes in with her daughter today who states that the patient went back to work and has been exerting herself for more than she should be.  Current Outpatient Prescriptions  Medication Sig Dispense Refill  . Iron Combinations (IRON COMPLEX PO) Take by mouth.    . metoprolol tartrate (LOPRESSOR) 25 MG tablet Take 0.5 tablets (12.5 mg total) by mouth 2 (two) times daily. 60 tablet 2  . lidocaine (LIDODERM) 5 % Place 1 patch onto the skin daily. Remove & Discard patch within 12 hours or as directed by MD (Patient not taking: Reported on 07/17/2015) 7 patch 0  . pantoprazole (PROTONIX) 40 MG tablet Take 1 tablet (40 mg total) by mouth 2 (two) times daily before a meal. 30 tablet 5   No current facility-administered medications for this visit.    Allergies as of 07/17/2015 - Review Complete 07/17/2015  Allergen Reaction Noted  . Demerol [meperidine] Itching 07/02/2015    ROS:  General: Negative for anorexia, weight loss, fever, chills, fatigue, weakness. ENT: Negative for hoarseness, difficulty swallowing , nasal congestion. CV: Negative for chest pain, angina, palpitations, dyspnea on exertion, peripheral edema.  Respiratory: Negative for dyspnea at rest, dyspnea on exertion, cough, sputum, wheezing.  GI: See history of present illness. GU:  Negative for dysuria, hematuria, urinary incontinence, urinary frequency, nocturnal urination.    Endo: Negative for unusual weight change.    Physical Examination:   BP 159/76 mmHg  Pulse 72  Temp(Src) 98.6 F (37 C) (Oral)  Ht 5\' 7"  (1.702 m)  Wt 240 lb (108.863 kg)  BMI 37.58 kg/m2  General: Well-nourished, well-developed in no acute distress.  Eyes: No icterus. Conjunctivae pink. Neuro: Alert and oriented x 3.  Grossly intact. Skin: Warm and dry, no jaundice.   Psych: Alert and cooperative, normal mood and affect.  Labs:    Imaging Studies: Dg Thoracic Spine 2 View  07/03/2015  CLINICAL DATA:  Back pain, heard a pop last night EXAM: THORACIC SPINE 2 VIEWS COMPARISON:  04/26/2014 FINDINGS: Four views of thoracic spine submitted. There is diffuse osteopenia. No acute fracture or subluxation. Alignment and vertebral body heights are preserved. Stable degenerative changes mid and lower thoracic spine. Degenerative changes are noted upper lumbar spine. Mild lower thoracic dextroscoliosis. IMPRESSION: No acute fracture or subluxation. Diffuse osteopenia. Stable degenerative changes mid and lower thoracic spine. Electronically Signed   By: Lahoma Crocker M.D.   On: 07/03/2015 09:23   Dg Chest Portable 1 View  07/02/2015  CLINICAL DATA:  Shortness of breath.  Cough for 2 weeks. EXAM: PORTABLE CHEST 1 VIEW COMPARISON:  04/26/2014 FINDINGS: Patient rotated minimally right on the frontal. Cardiomegaly accentuated by AP portable technique. Atherosclerosis in the transverse aorta. Moderate hiatal hernia. No pleural effusion or pneumothorax. Clear lungs. IMPRESSION: No acute cardiopulmonary disease. Cardiomegaly without congestive failure. Hiatal hernia. Electronically Signed   By: Abigail Miyamoto M.D.   On: 07/02/2015 16:46  Assessment and Plan:   Veronica Moon is a 70 y.o. y/o female who was in the hospital for a bleeding gastric ulcer. At the time of the upper endoscopy the bleeding had stopped but the patient had been having melena prior to admission. The patient's hemoglobin was also  low. The patient will have her hemoglobin checked today as well as an H. pylori. The patient also suffers from daily heartburn and will stay on her Protonix. The patient has been explained the plan and agrees with it.   Note: This dictation was prepared with Dragon dictation along with smaller phrase technology. Any transcriptional errors that result from this process are unintentional.

## 2015-07-18 ENCOUNTER — Telehealth: Payer: Self-pay

## 2015-07-18 LAB — MISC LABCORP TEST (SEND OUT): Labcorp test code: 163170

## 2015-07-18 NOTE — Telephone Encounter (Signed)
Pt notified of lab results. Still waiting on h pylori test results. Pt is currently taking Iron 325mg  twice daily.

## 2015-07-18 NOTE — Telephone Encounter (Signed)
-----   Message from Lucilla Lame, MD sent at 07/18/2015 12:48 PM EDT ----- Let the patient know that her blood count has come up to 10.4. Her iron is still low when she should consider taking an iron supplement.

## 2015-07-22 ENCOUNTER — Telehealth: Payer: Self-pay

## 2015-07-22 NOTE — Telephone Encounter (Signed)
-----   Message from Lucilla Lame, MD sent at 07/22/2015 12:41 PM EDT ----- Let the patient know her H. pylori test was negative.

## 2015-07-22 NOTE — Telephone Encounter (Signed)
Pt notified of results

## 2015-07-22 NOTE — Telephone Encounter (Signed)
LVM for pt to return my call.

## 2015-07-31 ENCOUNTER — Ambulatory Visit: Payer: Medicare Other | Admitting: Family Medicine

## 2016-09-01 ENCOUNTER — Emergency Department: Payer: Medicare Other

## 2016-09-01 ENCOUNTER — Emergency Department
Admission: EM | Admit: 2016-09-01 | Discharge: 2016-09-01 | Disposition: A | Discharge status: Home / Self Care | Payer: Medicare Other | Attending: Emergency Medicine | Admitting: Emergency Medicine

## 2016-09-01 ENCOUNTER — Encounter: Payer: Self-pay | Admitting: Emergency Medicine

## 2016-09-01 DIAGNOSIS — R0600 Dyspnea, unspecified: Secondary | ICD-10-CM

## 2016-09-01 DIAGNOSIS — D649 Anemia, unspecified: Secondary | ICD-10-CM

## 2016-09-01 DIAGNOSIS — Z79899 Other long term (current) drug therapy: Secondary | ICD-10-CM | POA: Diagnosis not present

## 2016-09-01 DIAGNOSIS — E041 Nontoxic single thyroid nodule: Secondary | ICD-10-CM | POA: Diagnosis not present

## 2016-09-01 DIAGNOSIS — I10 Essential (primary) hypertension: Secondary | ICD-10-CM | POA: Diagnosis not present

## 2016-09-01 DIAGNOSIS — R0602 Shortness of breath: Secondary | ICD-10-CM | POA: Diagnosis present

## 2016-09-01 LAB — CBC
HCT: 28.4 % — ABNORMAL LOW (ref 35.0–47.0)
HEMOGLOBIN: 9.3 g/dL — AB (ref 12.0–16.0)
MCH: 26.5 pg (ref 26.0–34.0)
MCHC: 32.5 g/dL (ref 32.0–36.0)
MCV: 81.4 fL (ref 80.0–100.0)
Platelets: 306 10*3/uL (ref 150–440)
RBC: 3.49 MIL/uL — ABNORMAL LOW (ref 3.80–5.20)
RDW: 16.6 % — AB (ref 11.5–14.5)
WBC: 8.3 10*3/uL (ref 3.6–11.0)

## 2016-09-01 LAB — BASIC METABOLIC PANEL
ANION GAP: 10 (ref 5–15)
BUN: 16 mg/dL (ref 6–20)
CALCIUM: 9.4 mg/dL (ref 8.9–10.3)
CO2: 21 mmol/L — ABNORMAL LOW (ref 22–32)
Chloride: 105 mmol/L (ref 101–111)
Creatinine, Ser: 1.14 mg/dL — ABNORMAL HIGH (ref 0.44–1.00)
GFR calc Af Amer: 55 mL/min — ABNORMAL LOW (ref >60)
GFR calc non Af Amer: 48 mL/min — ABNORMAL LOW (ref >60)
GLUCOSE: 101 mg/dL — AB (ref 65–99)
Potassium: 4.5 mmol/L (ref 3.5–5.1)
SODIUM: 136 mmol/L (ref 135–145)

## 2016-09-01 LAB — FIBRIN DERIVATIVES D-DIMER (ARMC ONLY): Fibrin derivatives D-dimer (ARMC): 554.01 — ABNORMAL HIGH (ref 0.00–499.00)

## 2016-09-01 LAB — GLUCOSE, CAPILLARY: Glucose-Capillary: 91 mg/dL (ref 65–99)

## 2016-09-01 LAB — TROPONIN I: Troponin I: <0.03 ng/mL (ref <0.03)

## 2016-09-01 LAB — BRAIN NATRIURETIC PEPTIDE: B NATRIURETIC PEPTIDE 5: 82 pg/mL (ref 0.0–100.0)

## 2016-09-01 MED ORDER — IOPAMIDOL (ISOVUE-370) INJECTION 76%
75.0000 mL | Freq: Once | INTRAVENOUS | Status: AC | PRN
  Administered 2016-09-01: 75 mL via INTRAVENOUS

## 2016-09-01 MED ORDER — IPRATROPIUM-ALBUTEROL 0.5-2.5 (3) MG/3ML IN SOLN
RESPIRATORY_TRACT | Status: AC
  Filled 2016-09-01: qty 3

## 2016-09-01 MED ORDER — HYDROCHLOROTHIAZIDE 25 MG PO TABS
ORAL_TABLET | ORAL | Status: AC
  Filled 2016-09-01: qty 1

## 2016-09-01 MED ORDER — IPRATROPIUM-ALBUTEROL 0.5-2.5 (3) MG/3ML IN SOLN: 3.0000 mL | Freq: Once | RESPIRATORY_TRACT | Status: DC

## 2016-09-01 MED ORDER — ALBUTEROL SULFATE (2.5 MG/3ML) 0.083% IN NEBU: 5.0000 mg | INHALATION_SOLUTION | Freq: Once | RESPIRATORY_TRACT | Status: DC

## 2016-09-01 MED ORDER — IPRATROPIUM-ALBUTEROL 0.5-2.5 (3) MG/3ML IN SOLN
3.0000 mL | Freq: Once | RESPIRATORY_TRACT | Status: AC
  Administered 2016-09-01: 3 mL via RESPIRATORY_TRACT

## 2016-09-01 MED ORDER — HYDROCHLOROTHIAZIDE 25 MG PO TABS: 25.0000 mg | ORAL_TABLET | Freq: Every day | ORAL | 0 refills | Status: DC

## 2016-09-01 MED ORDER — HYDROCHLOROTHIAZIDE 25 MG PO TABS
25.0000 mg | ORAL_TABLET | Freq: Once | ORAL | Status: AC
  Administered 2016-09-01: 25 mg via ORAL

## 2016-09-01 NOTE — ED Triage Notes (Signed)
First Nurse Note:  C/O SOB and fatigue.  Skin warm and dry.  No SOB/ DOE.

## 2016-09-01 NOTE — ED Notes (Signed)
Patient transported to CT 

## 2016-09-01 NOTE — ED Provider Notes (Addendum)
[facility] Emergency Department Provider Note ____________________________________________   I have reviewed the triage vital signs and the triage nursing note.  HISTORY  Chief Complaint Shortness of Breath   Historian Patient  HPI Veronica Moon is a 71 y.o. female with a history of untreated hypertension, does not follow up with her doctor, history of stomach ulcers that have bled in the past, last about one year ago when she was hospitalized and had blood transfusion, she has not followed up with GI.  Patient is here with her daughter who was finally able to get her to come to the emergency department because she does not follow up with doctors. Patient has had shortness of breath and dyspnea on exertion for about 2-3 years, worse over last 8 months or so. Seems like may be acutely worse over the past 1 week especially the last 2 days when she goes outside or walks around any short distances she becomes extremely dyspneic and has to stop to catch her breath. No history of known coronary artery disease, denies prior stress testing. No known diagnosis of congestive heart failure. She does have some lower extremity edema, equal on both sides. No one-sided leg swelling or calf tenderness. No fevers. No coughing. No history of wheezing or known underlying lung disease such as emphysema or asthma.   She does report pain to both thighs when she walks any distance at all.  Reports black stool about 2 weeks ago, no recurrent symptoms of that. Not take aspirin on, prior GI bleeding.    Past Medical History:  Diagnosis Date  . Blood transfusion without reported diagnosis   . Colon polyps   . Gastritis   . GIB (gastrointestinal bleeding)   . Hypertension     Patient Active Problem List   Diagnosis Date Noted  . Bleeding gastrointestinal   . Peptic ulcer of stomach   . GIB (gastrointestinal bleeding) 07/02/2015    Past Surgical History:  Procedure  Laterality Date  . CHOLECYSTECTOMY    . ESOPHAGOGASTRODUODENOSCOPY (EGD) WITH PROPOFOL N/A 07/03/2015   Procedure: ESOPHAGOGASTRODUODENOSCOPY (EGD) WITH PROPOFOL;  Surgeon: Lucilla Lame, MD;  Location: [facility] ENDOSCOPY;  Service: Endoscopy;  Laterality: N/A;    Prior to Admission medications   Medication Sig Start Date End Date Taking? Authorizing Provider  hydrochlorothiazide (HYDRODIURIL) 25 MG tablet Take 1 tablet (25 mg total) by mouth daily. 09/01/16   Lisa Roca, MD  Iron Combinations (IRON COMPLEX PO) Take 1 tablet by mouth daily.     [provider]  lidocaine (LIDODERM) 5 % Place 1 patch onto the skin daily. Remove & Discard patch within 12 hours or as directed by MD Patient not taking: Reported on 07/17/2015 07/05/15   Demetrios Loll, MD  metoprolol tartrate (LOPRESSOR) 25 MG tablet Take 0.5 tablets (12.5 mg total) by mouth 2 (two) times daily. Patient not taking: Reported on 09/01/2016 07/05/15   Demetrios Loll, MD  pantoprazole (PROTONIX) 40 MG tablet Take 1 tablet (40 mg total) by mouth 2 (two) times daily before a meal. Patient not taking: Reported on 09/01/2016 07/17/15   Lucilla Lame, MD    Allergies  Allergen Reactions  . Demerol [Meperidine] Itching    Family History  Problem Relation Age of Onset  . Hypertension Father   . Heart attack Father     Social History Social History  Substance Use Topics  . Smoking status: Never Smoker  . Smokeless tobacco: Never Used  . Alcohol use No  Review of Systems  Constitutional: Negative for fever. Eyes: Negative for visual changes. ENT: Negative for sore throat. Cardiovascular: Negative for chest pain. Respiratory: Positive for especially exertional shortness of breath. Gastrointestinal: Negative for abdominal pain, vomiting and diarrhea. Genitourinary: Negative for dysuria. Musculoskeletal: Negative for back pain. Skin: Negative for rash. Neurological: Negative for  headache.  ____________________________________________   PHYSICAL EXAM:  VITAL SIGNS: ED Triage Vitals  Enc Vitals Group     BP 09/01/16 1331 (!) 156/80     Pulse Rate 09/01/16 1331 79     Resp 09/01/16 1331 20     Temp 09/01/16 1331 98 F (36.7 C)     Temp Source 09/01/16 1331 Oral     SpO2 09/01/16 1331 98 %     Weight 09/01/16 1331 246 lb (111.6 kg)     Height 09/01/16 1331 5\' 8"  (1.727 m)     Head Circumference --      Peak Flow --      Pain Score 09/01/16 1334 0     Pain Loc --      Pain Edu? --      Excl. in [place]? --      Constitutional: Alert and oriented. Well appearing and in no distress. HEENT   Head: Normocephalic and atraumatic.      Eyes: Conjunctivae are normal. Pupils equal and round.       Ears:         Nose: No congestion/rhinnorhea.   Mouth/Throat: Mucous membranes are moist.   Neck: No stridor. Cardiovascular/Chest: Normal rate, regular rhythm.  No murmurs, rubs, or gallops. Respiratory: Normal respiratory effort without tachypnea nor retractions. Mildly tight breath sounds, but without active wheezing or rhonchi or rales. Gastrointestinal: Soft. No distention, no guarding, no rebound. Nontender.  Obese  Genitourinary/rectal:Deferred Musculoskeletal: Nontender with normal range of motion in all extremities. No joint effusions.  No lower extremity tenderness. 1+ lower extremity pitting edema bilaterally. Neurologic:  Normal speech and language. No gross or focal neurologic deficits are appreciated. Skin:  Skin is warm, dry and intact. No rash noted. Psychiatric: Mood and affect are normal. Speech and behavior are normal. Patient exhibits appropriate insight and judgment.   ____________________________________________  LABS (pertinent positives/negatives)  Labs Reviewed  BASIC METABOLIC PANEL - Abnormal; Notable for the following:       Result Value   CO2 21 (*)    Glucose, Bld 101 (*)    Creatinine, Ser 1.14 (*)    GFR calc non Af Amer  48 (*)    GFR calc Af Amer 55 (*)    All other components within normal limits  CBC - Abnormal; Notable for the following:    RBC 3.49 (*)    Hemoglobin 9.3 (*)    HCT 28.4 (*)    RDW 16.6 (*)    All other components within normal limits  FIBRIN DERIVATIVES D-DIMER ([facility] ONLY) - Abnormal; Notable for the following:    Fibrin derivatives D-dimer ([facility]) 554.01 (*)    All other components within normal limits  TROPONIN I  BRAIN NATRIURETIC PEPTIDE  GLUCOSE, CAPILLARY    ____________________________________________    EKG I, Lisa Roca, MD, the attending physician have personally viewed and interpreted all ECGs.  79 bpm. Normal sinus rhythm. Narrow QRS. Normal axis. Normal ST and T-wave ____________________________________________  RADIOLOGY All Xrays were viewed by me. Imaging interpreted by Radiologist.  Chest x-ray two-view:  IMPRESSION: No acute cardiopulmonary process.  Moderate to large gas-filled hiatal hernia.   Chest  CT angiogram for PE:   CLINICAL DATA: Intermittent dyspnea with walking and exertion for months.  EXAM: CT ANGIOGRAPHY CHEST WITH CONTRAST  TECHNIQUE: Multidetector CT imaging of the chest was performed using the standard protocol during bolus administration of intravenous contrast. Multiplanar CT image reconstructions and MIPs were obtained to evaluate the vascular anatomy.  CONTRAST: 75 cc Isovue 370 IV  COMPARISON: Same day CXR  FINDINGS: Cardiovascular: Common origin of the brachiocephalic trunk and left common carotid artery off the aorta. No aortic aneurysm or dissection. Satisfactory opacification of the pulmonary artery is without evidence of pulmonary embolus. Heart is borderline enlarged with 3 vessel coronary arteriosclerosis. No pericardial effusion. Minimal aortic atherosclerosis. No aortic aneurysm.  Mediastinum/Nodes: 11 x 8 mm hypodense nodule of the right thyroid gland without thyromegaly. Moderate-sized hiatal  hernia. Mainstem bronchi and trachea are patent. Thoracic esophagus is unremarkable. No adenopathy.  Lungs/Pleura: No effusion or pneumothorax. Subsegmental atelectasis in the left lower lobe and lingula. No dominant mass noted.  Upper Abdomen: Cholecystectomy. No space-occupying mass of the included liver, pancreas, spleen and adrenal glands. The included upper poles of both kidneys are nonacute.  Musculoskeletal: No chest wall abnormality. No acute or significant osseous findings.  Review of the MIP images confirms the above findings.  IMPRESSION: 1. Moderate-sized hiatal hernia. 2. No acute pulmonary embolus. 3. Minimal aortic atherosclerosis and 3 vessel coronary arteriosclerosis. 4. 11 x 8 mm hypodense nodule of the right thyroid gland without thyromegaly. Consider further evaluation with thyroid ultrasound. If patient is clinically hyperthyroid, consider nuclear medicine thyroid uptake and scan.  Aortic Atherosclerosis (ICD10-I70.0).      __________________________________________  PROCEDURES  Procedure(s) performed: None  Critical Care performed: None  ____________________________________________   ED COURSE / ASSESSMENT AND PLAN  Pertinent labs & imaging results that were available during my care of the patient were reviewed by me and considered in my medical decision making (see chart for details).   Mr. Foucher has a history of hypertension for which she is not currently on treatment for and does not follow up with Drs. She is here with dyspnea which sounds like it's been chronic over 2-3 years worsening over the past 8 months, and potentially acutely worse over the last several days. She denies frank chest pain, but states that she has pretty severe exertional dyspnea.  She has lower extremity edema which is equal bilaterally. Chest x-ray is negative for focal infiltrate, signs of COPD, or pulmonary edema. I did add on BNP and this was reassuring.  No  one-sided signs of DVT, however d-dimer was elevated, I did discuss with them obtaining a chest CT to rule out pulmonary embolism.  I'm also concerned about the possibility of coronary artery disease causing her severe dyspnea on exertion, concerning for anginal equivalent.   She reports black stool 2 weeks ago, hemoglobin 9. Prior hemoglobin was 10, but that was a year ago. No ongoing complaints of black stool.  She is complaining of exertional bilateral leg fatigue, which sounds more like possible arterial insufficiency for which she may need to follow up as an outpatient with vascular surgeon for further evaluation.  On more concerned about her severe pain on exertion, walking here although she does not get hypoxic, she does become extremely dyspneic with tachypnea not walking too far down the hall. I think she does need further evaluation. She is at extreme risk of not following up with outpatient doctors as I had recommended she will likely need primary care, cardiology, as well  as pulmonology as well as vascular.  I do not be in reasonable to have her evaluated from the acute dyspnea and cardiac standpoint overnight here in the hospital. Her family is very adamant that she stay in the hospital rather than go home tonight.  CT for PE shows no PE.  Patient stated she would rather go home. She agrees that she will follow up with cardiology, primary, vascular and pulmonology.        CONSULTATIONS:  None   Patient / Family / Caregiver informed of clinical course, medical decision-making process, and agree with plan.  ___________________________________________   FINAL CLINICAL IMPRESSION(S) / ED DIAGNOSES   Final diagnoses:  Dyspnea, unspecified type  Anemia, unspecified type  Thyroid nodule  Essential hypertension              Note: This dictation was prepared with Dragon dictation. Any transcriptional errors that result from this process are unintentional     Lisa Roca, MD 09/01/16 Karl Bales    Lisa Roca, MD 09/01/16 2051

## 2016-09-01 NOTE — ED Notes (Signed)
Pt ambulated down the hallway with this RN. Pt oxygen saturation stays 98% or above on RA. Pt does appear SOB and states that her legs cramp while walking. Pt HR goes from 80's to 100. Pt back in stretcher at this time.

## 2016-09-01 NOTE — Discharge Instructions (Signed)
You were evaluated for fatigue, shortness of breath with exertion, and leg pain with walking.  Your emergency department evaluation is overall reassuring, but as we discussed  you need multiple close follow ups with primary doctor and specialists.   Return to the emergency department immediately for any worsening condition including chest pain, passing out, worsening trouble breathing or shortness of breath, fever.  You're being started on blood pressure medication hydrochlorthiazide.  Also start baby aspirin- 81 mg aspirin daily.

## 2016-09-01 NOTE — ED Notes (Addendum)
ED Provider at bedside to update family 

## 2016-09-01 NOTE — ED Triage Notes (Signed)
Patient presents to ED via POV from home with c/o SOB x 6-8 months. Patient does not have a PCP and has not been seen for same. Patient reports increased fatigue. A&O x4. Patient able to speak full complete sentences.

## 2016-09-01 NOTE — ED Notes (Signed)
Pt reports that she has been feeling SOB intermittently with walking and exertion for months. Pt denies any chest pain or new swelling from her baseline. Pt is not SOB when she is resting. Denies hx of breathing problems.

## 2016-09-01 NOTE — ED Notes (Signed)
Pt assisted to go to the bathroom, urinated and back in bed at this time. Pt does get SOB when ambulating. Comfortable with breathing at rest.

## 2016-09-01 NOTE — ED Notes (Signed)
Pt having shivering episode. Given warm blankets, CBG checked. Pt given apple juice. Denies wanting to eat anything. Pt family concerned about BP. EDP informed. Orders received. Pt alert and oriented X4, active, cooperative, pt in NAD. RR even and unlabored, color WNL.  Shivering has ceased at this time.

## 2016-11-02 ENCOUNTER — Ambulatory Visit: Payer: Medicare Other | Admitting: Family

## 2017-12-04 ENCOUNTER — Other Ambulatory Visit: Payer: Self-pay

## 2017-12-04 ENCOUNTER — Inpatient Hospital Stay
Admission: EM | Admit: 2017-12-04 | Discharge: 2017-12-06 | DRG: 378 | Disposition: A | Discharge status: Home / Self Care | Payer: Medicare HMO | Attending: Internal Medicine | Admitting: Internal Medicine

## 2017-12-04 DIAGNOSIS — Z8711 Personal history of peptic ulcer disease: Secondary | ICD-10-CM | POA: Diagnosis not present

## 2017-12-04 DIAGNOSIS — E669 Obesity, unspecified: Secondary | ICD-10-CM | POA: Diagnosis present

## 2017-12-04 DIAGNOSIS — D62 Acute posthemorrhagic anemia: Secondary | ICD-10-CM | POA: Diagnosis present

## 2017-12-04 DIAGNOSIS — I10 Essential (primary) hypertension: Secondary | ICD-10-CM | POA: Diagnosis present

## 2017-12-04 DIAGNOSIS — Z8719 Personal history of other diseases of the digestive system: Secondary | ICD-10-CM

## 2017-12-04 DIAGNOSIS — K921 Melena: Secondary | ICD-10-CM | POA: Diagnosis not present

## 2017-12-04 DIAGNOSIS — R739 Hyperglycemia, unspecified: Secondary | ICD-10-CM | POA: Diagnosis present

## 2017-12-04 DIAGNOSIS — Z6837 Body mass index (BMI) 37.0-37.9, adult: Secondary | ICD-10-CM | POA: Diagnosis not present

## 2017-12-04 DIAGNOSIS — K922 Gastrointestinal hemorrhage, unspecified: Secondary | ICD-10-CM | POA: Diagnosis present

## 2017-12-04 DIAGNOSIS — Z885 Allergy status to narcotic agent status: Secondary | ICD-10-CM

## 2017-12-04 DIAGNOSIS — K449 Diaphragmatic hernia without obstruction or gangrene: Secondary | ICD-10-CM | POA: Diagnosis present

## 2017-12-04 DIAGNOSIS — Z9049 Acquired absence of other specified parts of digestive tract: Secondary | ICD-10-CM

## 2017-12-04 DIAGNOSIS — Z8249 Family history of ischemic heart disease and other diseases of the circulatory system: Secondary | ICD-10-CM

## 2017-12-04 DIAGNOSIS — T39395A Adverse effect of other nonsteroidal anti-inflammatory drugs [NSAID], initial encounter: Secondary | ICD-10-CM | POA: Diagnosis present

## 2017-12-04 DIAGNOSIS — K2961 Other gastritis with bleeding: Secondary | ICD-10-CM | POA: Diagnosis present

## 2017-12-04 DIAGNOSIS — A0472 Enterocolitis due to Clostridium difficile, not specified as recurrent: Secondary | ICD-10-CM | POA: Diagnosis present

## 2017-12-04 DIAGNOSIS — K317 Polyp of stomach and duodenum: Secondary | ICD-10-CM | POA: Diagnosis present

## 2017-12-04 DIAGNOSIS — K3189 Other diseases of stomach and duodenum: Secondary | ICD-10-CM | POA: Diagnosis not present

## 2017-12-04 HISTORY — DX: Malignant (primary) neoplasm, unspecified: C80.1

## 2017-12-04 LAB — COMPREHENSIVE METABOLIC PANEL WITH GFR
ALT: 13 U/L (ref 0–44)
AST: 24 U/L (ref 15–41)
Albumin: 3.7 g/dL (ref 3.5–5.0)
Alkaline Phosphatase: 67 U/L (ref 38–126)
Anion gap: 8 (ref 5–15)
BUN: 44 mg/dL — ABNORMAL HIGH (ref 8–23)
CO2: 23 mmol/L (ref 22–32)
Calcium: 8.6 mg/dL — ABNORMAL LOW (ref 8.9–10.3)
Chloride: 105 mmol/L (ref 98–111)
Creatinine, Ser: 0.85 mg/dL (ref 0.44–1.00)
GFR calc Af Amer: >60 mL/min
GFR calc non Af Amer: >60 mL/min
Glucose, Bld: 145 mg/dL — ABNORMAL HIGH (ref 70–99)
Potassium: 4.7 mmol/L (ref 3.5–5.1)
Sodium: 136 mmol/L (ref 135–145)
Total Bilirubin: 0.8 mg/dL (ref 0.3–1.2)
Total Protein: 7.1 g/dL (ref 6.5–8.1)

## 2017-12-04 LAB — PREPARE RBC (CROSSMATCH)

## 2017-12-04 LAB — CBC
HCT: 27.4 % — ABNORMAL LOW (ref 36.0–46.0)
Hemoglobin: 7.9 g/dL — ABNORMAL LOW (ref 12.0–15.0)
MCH: 24.8 pg — ABNORMAL LOW (ref 26.0–34.0)
MCHC: 28.8 g/dL — ABNORMAL LOW (ref 30.0–36.0)
MCV: 86.2 fL (ref 80.0–100.0)
Platelets: 409 K/uL — ABNORMAL HIGH (ref 150–400)
RBC: 3.18 MIL/uL — ABNORMAL LOW (ref 3.87–5.11)
RDW: 15.8 % — ABNORMAL HIGH (ref 11.5–15.5)
WBC: 10.4 K/uL (ref 4.0–10.5)
nRBC: 0 % (ref 0.0–0.2)

## 2017-12-04 LAB — PROTIME-INR
INR: 0.91
Prothrombin Time: 12.2 seconds (ref 11.4–15.2)

## 2017-12-04 MED ORDER — HYDROCHLOROTHIAZIDE 25 MG PO TABS
25.0000 mg | ORAL_TABLET | Freq: Every day | ORAL | Status: DC
  Administered 2017-12-04 – 2017-12-06 (×3): 25 mg via ORAL
  Filled 2017-12-04 (×3): qty 1

## 2017-12-04 MED ORDER — SODIUM CHLORIDE 0.9 % IV SOLN: 10.0000 mL/h | Freq: Once | INTRAVENOUS | Status: DC

## 2017-12-04 MED ORDER — SODIUM CHLORIDE 0.9 % IV SOLN
INTRAVENOUS | Status: DC
  Administered 2017-12-04 – 2017-12-06 (×4): via INTRAVENOUS

## 2017-12-04 MED ORDER — VITAMIN C 500 MG PO TABS
500.0000 mg | ORAL_TABLET | Freq: Every day | ORAL | Status: DC
  Administered 2017-12-04 – 2017-12-06 (×3): 500 mg via ORAL
  Filled 2017-12-04 (×3): qty 1

## 2017-12-04 MED ORDER — VITAMIN B-12 1000 MCG PO TABS
1000.0000 ug | ORAL_TABLET | Freq: Every day | ORAL | Status: DC
  Administered 2017-12-04 – 2017-12-06 (×3): 1000 ug via ORAL
  Filled 2017-12-04 (×3): qty 1

## 2017-12-04 MED ORDER — TRAMADOL HCL 50 MG PO TABS
ORAL_TABLET | ORAL | Status: AC
  Administered 2017-12-04: 15:00:00
  Filled 2017-12-04: qty 1

## 2017-12-04 MED ORDER — SODIUM CHLORIDE 0.9 % IV SOLN
8.0000 mg/h | INTRAVENOUS | Status: DC
  Administered 2017-12-04 (×3): 8 mg/h via INTRAVENOUS
  Filled 2017-12-04 (×2): qty 80

## 2017-12-04 MED ORDER — ACETAMINOPHEN 650 MG RE SUPP: 650.0000 mg | Freq: Four times a day (QID) | RECTAL | Status: DC | PRN

## 2017-12-04 MED ORDER — SODIUM CHLORIDE 0.9 % IV SOLN
80.0000 mg | Freq: Once | INTRAVENOUS | Status: AC
  Administered 2017-12-04: 80 mg via INTRAVENOUS
  Filled 2017-12-04: qty 80

## 2017-12-04 MED ORDER — ACETAMINOPHEN 325 MG PO TABS
650.0000 mg | ORAL_TABLET | Freq: Four times a day (QID) | ORAL | Status: DC | PRN
  Administered 2017-12-04: 650 mg via ORAL
  Filled 2017-12-04: qty 2

## 2017-12-04 MED ORDER — ONDANSETRON HCL 4 MG/2ML IJ SOLN: 4.0000 mg | Freq: Four times a day (QID) | INTRAMUSCULAR | Status: DC | PRN

## 2017-12-04 MED ORDER — VITAMIN D 25 MCG (1000 UNIT) PO TABS
1000.0000 [IU] | ORAL_TABLET | Freq: Every day | ORAL | Status: DC
  Administered 2017-12-04 – 2017-12-06 (×3): 1000 [IU] via ORAL

## 2017-12-04 MED ORDER — ONDANSETRON HCL 4 MG PO TABS: 4.0000 mg | ORAL_TABLET | Freq: Four times a day (QID) | ORAL | Status: DC | PRN

## 2017-12-04 MED ORDER — HYDRALAZINE HCL 20 MG/ML IJ SOLN: 5.0000 mg | INTRAMUSCULAR | Status: DC | PRN

## 2017-12-04 MED ORDER — TRAMADOL HCL 50 MG PO TABS: 50.0000 mg | ORAL_TABLET | Freq: Once | ORAL | Status: DC

## 2017-12-04 MED ORDER — SODIUM CHLORIDE 0.9 % IV BOLUS
1000.0000 mL | Freq: Once | INTRAVENOUS | Status: AC
  Administered 2017-12-04: 1000 mL via INTRAVENOUS

## 2017-12-04 NOTE — Progress Notes (Signed)
Family Meeting Note  Advance Directive:yes  Today a meeting took place with the Patient and daughters.  Patient is able to participate.  The following clinical team members were present during this meeting:MD  The following were discussed:Patient's diagnosis: , Patient's progosis: Unable to determine and Goals for treatment: DNI   Patient states that she is overall pretty healthy, besides having recurrent gastric ulcers.  She states she would never want to be intubated, but she would be agreeable to cardiac resuscitative measures.  Family states that they will continue to discuss her code status, as her daughters wish for her to be full code.  They will let the medical team know if they would like to make any changes to her code status.  Additional follow-up to be provided: prn  Time spent during discussion:20 minutes  Evette Doffing, MD

## 2017-12-04 NOTE — H&P (Addendum)
[facility] [place] NAME: Veronica Moon    MR#:  [id number]  DATE OF BIRTH:  May 19, 1945  DATE OF ADMISSION:  12/04/2017  PRIMARY CARE PHYSICIAN: Patient, No Pcp Per   REQUESTING/REFERRING PHYSICIAN: Harvest Dark, MD  CHIEF COMPLAINT:   Chief Complaint  Patient presents with  . Melena    HISTORY OF PRESENT ILLNESS:  Veronica Moon  is a 72 y.o. female with a known history of HTN, gastritis, and PUD who presented to the ED with melena that started yesterday afternoon.  She reports 3 dark, black bowel movement since yesterday.  She then began having weakness and lightheadedness.  She denies any abdominal pain or vomiting.  No bright red blood per rectum.  She has a history of recurrent melena due to peptic ulcer disease.  She was last hospitalized in 2017 for this.  She does not take any NSAIDs at home.  In the ED, hemoglobin was 7.9.  She was transfused 1 unit packed red blood cells.  Hospitalists were called for admission.  PAST MEDICAL HISTORY:   Past Medical History:  Diagnosis Date  . Blood transfusion without reported diagnosis   . Colon polyps   . Gastritis   . GIB (gastrointestinal bleeding)   . Hypertension     PAST SURGICAL HISTORY:   Past Surgical History:  Procedure Laterality Date  . CHOLECYSTECTOMY    . ESOPHAGOGASTRODUODENOSCOPY (EGD) WITH PROPOFOL N/A 07/03/2015   Procedure: ESOPHAGOGASTRODUODENOSCOPY (EGD) WITH PROPOFOL;  Surgeon: Lucilla Lame, MD;  Location: [facility] ENDOSCOPY;  Service: Endoscopy;  Laterality: N/A;    SOCIAL HISTORY:   Social History   Tobacco Use  . Smoking status: Never Smoker  . Smokeless tobacco: Never Used  Substance Use Topics  . Alcohol use: No    FAMILY HISTORY:   Family History  Problem Relation Age of Onset  . Hypertension Father   . Heart attack Father     DRUG ALLERGIES:   Allergies  Allergen Reactions  . Demerol [Meperidine] Itching    REVIEW OF  SYSTEMS:   Review of Systems  Constitutional: Positive for malaise/fatigue. Negative for chills and fever.  HENT: Negative for congestion and sore throat.   Eyes: Negative for blurred vision and double vision.  Respiratory: Negative for cough and shortness of breath.   Cardiovascular: Negative for chest pain, palpitations and leg swelling.  Gastrointestinal: Positive for melena. Negative for abdominal pain, blood in stool, constipation, diarrhea, nausea and vomiting.  Genitourinary: Negative for dysuria, frequency and hematuria.  Musculoskeletal: Negative for back pain and neck pain.  Neurological: Negative for dizziness and headaches.  Psychiatric/Behavioral: Negative for depression. The patient is not nervous/anxious.     MEDICATIONS AT HOME:   Prior to Admission medications   Medication Sig Start Date End Date Taking? Authorizing Provider  cholecalciferol (VITAMIN D3) 25 MCG (1000 UT) tablet Take 1,000 Units by mouth daily.   Yes [provider]  pantoprazole (PROTONIX) 40 MG tablet Take 40 mg by mouth daily.   Yes [provider]  vitamin B-12 (CYANOCOBALAMIN) 1000 MCG tablet Take 1,000 mcg by mouth daily.   Yes [provider]  vitamin C (ASCORBIC ACID) 500 MG tablet Take 500 mg by mouth daily.   Yes [provider]  hydrochlorothiazide (HYDRODIURIL) 25 MG tablet Take 1 tablet (25 mg total) by mouth daily. 09/01/16   Lisa Roca, MD  lidocaine (LIDODERM) 5 % Place 1 patch onto the skin daily. Remove & Discard patch  within 12 hours or as directed by MD Patient not taking: Reported on 07/17/2015 07/05/15   Demetrios Loll, MD  metoprolol tartrate (LOPRESSOR) 25 MG tablet Take 0.5 tablets (12.5 mg total) by mouth 2 (two) times daily. Patient not taking: Reported on 09/01/2016 07/05/15   Demetrios Loll, MD  pantoprazole (PROTONIX) 40 MG tablet Take 1 tablet (40 mg total) by mouth 2 (two) times daily before a meal. Patient not taking: Reported on 09/01/2016 07/17/15    Lucilla Lame, MD      VITAL SIGNS:  Blood pressure (!) 155/69, temperature 98.3 F (36.8 C), temperature source Oral, resp. rate (!) 23, height 5\' 8"  (1.727 m), weight 111.1 kg, SpO2 100 %.  PHYSICAL EXAMINATION:  Physical Exam  GENERAL:  72 y.o.-year-old patient lying in the bed with no acute distress.  EYES: Pupils equal, round, reactive to light and accommodation. No scleral icterus. Extraocular muscles intact. +conjunctival pallor HEENT: Head atraumatic, normocephalic. Oropharynx and nasopharynx clear.  Moist mucous membranes NECK:  Supple, no jugular venous distention. No thyroid enlargement, no tenderness.  LUNGS: Normal breath sounds bilaterally, no wheezing, rales,rhonchi or crepitation. No use of accessory muscles of respiration.  CARDIOVASCULAR: RRR, S1, S2 normal. No murmurs, rubs, or gallops.  ABDOMEN: Soft, nontender, nondistended. Bowel sounds present. No organomegaly or mass.  EXTREMITIES: No pedal edema, cyanosis, or clubbing.  NEUROLOGIC: Cranial nerves II through XII are intact. +global weakness. Sensation intact. Gait not checked.  PSYCHIATRIC: The patient is alert and oriented x 3.  SKIN: No obvious rash, lesion, or ulcer.   LABORATORY PANEL:   CBC Recent Labs  Lab 12/04/17 0819  WBC 10.4  HGB 7.9*  HCT 27.4*  PLT 409*   ------------------------------------------------------------------------------------------------------------------  Chemistries  Recent Labs  Lab 12/04/17 0819  NA 136  K 4.7  CL 105  CO2 23  GLUCOSE 145*  BUN 44*  CREATININE 0.85  CALCIUM 8.6*  AST 24  ALT 13  ALKPHOS 67  BILITOT 0.8   ------------------------------------------------------------------------------------------------------------------  Cardiac Enzymes No results for input(s): TROPONINI in the last 168 hours. ------------------------------------------------------------------------------------------------------------------  RADIOLOGY:  No results  found.    IMPRESSION AND PLAN:   Melena- likely gastritis vs PUD, given her history.  Last EGD was in 2017 and showed non-bleeding gastric ulcers. No NSAID use. -GI consult -Check H. Pylori due to recurrent nature of ulcers -IV PPI -NPO -MIVFs  Acute blood loss anemia-due to above. -Received 1 unit pRBCs in the ED -Monitor H/H  Hypertension- BPs elevated in the ED -Continue home hydrochlorothiazide -Hydralazine PRN  Hyperglycemia- glucose 145 in the ED -Check A1c  DVT prophylaxis-SCDs   All the records are reviewed and case discussed with ED provider. Management plans discussed with the patient, family and they are in agreement.  CODE STATUS: DNI  TOTAL TIME TAKING CARE OF THIS PATIENT: 45 minutes.    Berna Spare Ruhi Kopke M.D on 12/04/2017 at 10:22 AM  Between 7am to 6pm - Pager - [phone]  After 6pm go to [email or web address] - Proofreader  [facility]  Office  [phone]  CC: Primary care physician; Patient, No Pcp Per   Note: This dictation was prepared with Dragon dictation along with smaller phrase technology. Any transcriptional errors that result from this process are unintentional.

## 2017-12-04 NOTE — ED Triage Notes (Signed)
Pt arrives via [facility] from home c/o melena x 1 day. Hx bleeding ulcer requiring transfusions several years ago. Protocol initiated.

## 2017-12-04 NOTE — Consult Note (Signed)
Vonda Antigua, MD [address] [place], [place], [place], [address] [address], [place], [place], [place], [address] Phone: [phone]  Fax: [phone]  Consultation  Referring Provider:     Dr. Brett Albino Primary Care Physician:  Patient, No Pcp Per Reason for Consultation:     Melena, anemia  Date of Admission:  12/04/2017 Date of Consultation:  12/04/2017         HPI:   Veronica Moon is a 72 y.o. female with history of GI bleed in 2016 and 2017 here with melena for 1 to 2 days and associated fatigue.  Patient denies any NSAID use recently.  Reports taking Advil at the time of her previous GI bleeds and states because of that she has completely stopped all NSAIDs including anything with aspirin.  Denies any abdominal pain or nausea vomiting.  Reports noting 3-4 melanotic bowel movements, last one being over 12 hours ago.  Patient had an EGD in June 2017 due to melena, which showed nonbleeding gastric ulcers and patient was placed on PPI.  She had a similar episode in 2016 and Dr. Percell Boston notes from that visit states "Given the studies of the EGD showing gastritis, the capsule showing small bowel without bleeding source and the colonoscopy showing diverticulosis and a colon polyp she most likely bled from the gastritis and would continue PPI treatment and give carafate 62min before meals and follow up with CBC in 1-2 weeks.  Woulld check a celiac panel before discharge."  The colonoscopy report is available under provation, and extent of exam was to cecum, with good prep.  Past Medical History:  Diagnosis Date  . Blood transfusion without reported diagnosis   . Colon polyps   . Gastritis   . GIB (gastrointestinal bleeding)   . Hypertension     Past Surgical History:  Procedure Laterality Date  . CHOLECYSTECTOMY    . ESOPHAGOGASTRODUODENOSCOPY (EGD) WITH PROPOFOL N/A 07/03/2015   Procedure: ESOPHAGOGASTRODUODENOSCOPY (EGD) WITH PROPOFOL;  Surgeon: Lucilla Lame, MD;   Location: [facility] ENDOSCOPY;  Service: Endoscopy;  Laterality: N/A;    Prior to Admission medications   Medication Sig Start Date End Date Taking? Authorizing Provider  cholecalciferol (VITAMIN D3) 25 MCG (1000 UT) tablet Take 1,000 Units by mouth daily.   Yes [provider]  pantoprazole (PROTONIX) 40 MG tablet Take 40 mg by mouth daily.   Yes [provider]  vitamin B-12 (CYANOCOBALAMIN) 1000 MCG tablet Take 1,000 mcg by mouth daily.   Yes [provider]  vitamin C (ASCORBIC ACID) 500 MG tablet Take 500 mg by mouth daily.   Yes [provider]  hydrochlorothiazide (HYDRODIURIL) 25 MG tablet Take 1 tablet (25 mg total) by mouth daily. 09/01/16   Lisa Roca, MD  lidocaine (LIDODERM) 5 % Place 1 patch onto the skin daily. Remove & Discard patch within 12 hours or as directed by MD Patient not taking: Reported on 07/17/2015 07/05/15   Demetrios Loll, MD  metoprolol tartrate (LOPRESSOR) 25 MG tablet Take 0.5 tablets (12.5 mg total) by mouth 2 (two) times daily. Patient not taking: Reported on 09/01/2016 07/05/15   Demetrios Loll, MD  pantoprazole (PROTONIX) 40 MG tablet Take 1 tablet (40 mg total) by mouth 2 (two) times daily before a meal. Patient not taking: Reported on 09/01/2016 07/17/15   Lucilla Lame, MD    Family History  Problem Relation Age of Onset  . Hypertension Father   . Heart attack Father      Social  History   Tobacco Use  . Smoking status: Never Smoker  . Smokeless tobacco: Never Used  Substance Use Topics  . Alcohol use: No  . Drug use: No    Allergies as of 12/04/2017 - Review Complete 12/04/2017  Allergen Reaction Noted  . Demerol [meperidine] Itching 07/02/2015    Review of Systems:    All systems reviewed and negative except where noted in HPI.   Physical Exam:  Vital signs in last 24 hours: Vitals:   12/04/17 1315 12/04/17 1330 12/04/17 1331 12/04/17 1354  BP: (!) 154/72 125/68 125/68 (!) 156/69  Pulse: 80 82 83 87  Resp: 17 17 16  17   Temp:   97.8 F (36.6 C) 98.1 F (36.7 C)  TempSrc:   Oral Oral  SpO2: 100% 100%  100%  Weight:      Height:       Last BM Date: 12/04/17 General:   Pleasant, cooperative in NAD Head:  Normocephalic and atraumatic. Eyes:   No icterus.   Conjunctiva pink. PERRLA. Ears:  Normal auditory acuity. Neck:  Supple; no masses or thyroidomegaly Lungs: Respirations even and unlabored. Lungs clear to auscultation bilaterally.   No wheezes, crackles, or rhonchi.  Abdomen:  Soft, nondistended, nontender. Normal bowel sounds. No appreciable masses or hepatomegaly.  No rebound or guarding.  Neurologic:  Alert and oriented x3;  grossly normal neurologically. Skin:  Intact without significant lesions or rashes. Cervical Nodes:  No significant cervical adenopathy. Psych:  Alert and cooperative. Normal affect.  LAB RESULTS: Recent Labs    12/04/17 0819  WBC 10.4  HGB 7.9*  HCT 27.4*  PLT 409*   BMET Recent Labs    12/04/17 0819  NA 136  K 4.7  CL 105  CO2 23  GLUCOSE 145*  BUN 44*  CREATININE 0.85  CALCIUM 8.6*   LFT Recent Labs    12/04/17 0819  PROT 7.1  ALBUMIN 3.7  AST 24  ALT 13  ALKPHOS 67  BILITOT 0.8   PT/INR Recent Labs    12/04/17 0819  LABPROT 12.2  INR 0.91    STUDIES: No results found.    Impression / Plan:   Veronica Moon is a 72 y.o. y/o female with melena and anemia will, with previous history of gastric ulcers noted on EGD in 2017  Possible repeat GI bleed from known gastric ulcers H. pylori serology has been ordered from primary team and pending  Follow-up and treat if positive  PPI IV twice daily  Continue serial CBCs and transfuse PRN Avoid NSAIDs Maintain 2 large-bore IV lines Please page GI with any acute hemodynamic changes, or signs of active GI bleeding  We will plan on EGD in 1 to 2 days depending on clinical status and medical optimization  Patient was already started on a solid food diet today, therefore unable to  proceed with EGD today  N.p.o. past midnight for possible procedure tomorrow versus the next day  I have discussed alternative options, risks & benefits,  which include, but are not limited to, bleeding, infection, perforation,respiratory complication & drug reaction.  The patient agrees with this plan & written consent will be obtained.     Thank you for involving me in the care of this patient.      LOS: 0 days   Virgel Manifold, MD  12/04/2017, 5:34 PM

## 2017-12-04 NOTE — ED Notes (Signed)
Pt is resting family at bedside. No reaction to transfusion at this time pt states pain and pain medications were given. Janett Billow RN is aware.

## 2017-12-04 NOTE — ED Notes (Signed)
Type and screen redrawn and sent to the lab.

## 2017-12-04 NOTE — ED Provider Notes (Signed)
[facility] Emergency Department Provider Note  Time seen: 8:27 AM  I have reviewed the triage vital signs and the nursing notes.   HISTORY  Chief Complaint Melena    HPI Veronica Moon is a 72 y.o. female with a past medical history of gastritis, bleeding gastric ulcer, hypertension, presents to the emergency department for rectal bleeding.  According to the patient yesterday she began feeling somewhat weak, noted a small darker appearing stool yesterday.  This morning states she passed a large bowel movement that was extremely dark in color consistent with prior GI bleeds.  Denies any pain, denies abdominal pain, largely negative review of systems.  Past Medical History:  Diagnosis Date  . Blood transfusion without reported diagnosis   . Colon polyps   . Gastritis   . GIB (gastrointestinal bleeding)   . Hypertension     Patient Active Problem List   Diagnosis Date Noted  . Bleeding gastrointestinal   . Peptic ulcer of stomach   . GIB (gastrointestinal bleeding) 07/02/2015    Past Surgical History:  Procedure Laterality Date  . CHOLECYSTECTOMY    . ESOPHAGOGASTRODUODENOSCOPY (EGD) WITH PROPOFOL N/A 07/03/2015   Procedure: ESOPHAGOGASTRODUODENOSCOPY (EGD) WITH PROPOFOL;  Surgeon: Lucilla Lame, MD;  Location: [facility] ENDOSCOPY;  Service: Endoscopy;  Laterality: N/A;    Prior to Admission medications   Medication Sig Start Date End Date Taking? Authorizing Provider  hydrochlorothiazide (HYDRODIURIL) 25 MG tablet Take 1 tablet (25 mg total) by mouth daily. 09/01/16   Lisa Roca, MD  Iron Combinations (IRON COMPLEX PO) Take 1 tablet by mouth daily.     [provider]  lidocaine (LIDODERM) 5 % Place 1 patch onto the skin daily. Remove & Discard patch within 12 hours or as directed by MD Patient not taking: Reported on 07/17/2015 07/05/15   Demetrios Loll, MD  metoprolol tartrate (LOPRESSOR) 25 MG tablet Take 0.5 tablets (12.5 mg total) by mouth 2  (two) times daily. Patient not taking: Reported on 09/01/2016 07/05/15   Demetrios Loll, MD  pantoprazole (PROTONIX) 40 MG tablet Take 1 tablet (40 mg total) by mouth 2 (two) times daily before a meal. Patient not taking: Reported on 09/01/2016 07/17/15   Lucilla Lame, MD    Allergies  Allergen Reactions  . Demerol [Meperidine] Itching    Family History  Problem Relation Age of Onset  . Hypertension Father   . Heart attack Father     Social History Social History   Tobacco Use  . Smoking status: Never Smoker  . Smokeless tobacco: Never Used  Substance Use Topics  . Alcohol use: No  . Drug use: No    Review of Systems Constitutional: Negative for fever. Cardiovascular: Negative for chest pain. Respiratory: Negative for shortness of breath. Gastrointestinal: Negative for abdominal pain.  Positive for loose dark stool. Genitourinary: Negative for urinary compaints Musculoskeletal: Negative for musculoskeletal complaints Skin: Negative for skin complaints  Neurological: Negative for headache All other ROS negative  ____________________________________________   PHYSICAL EXAM:  Constitutional: Alert and oriented. Well appearing and in no distress. Eyes: Normal exam ENT   Head: Normocephalic and atraumatic.   Mouth/Throat: Mucous membranes are moist. Cardiovascular: Normal rate, regular rhythm. No murmur Respiratory: Normal respiratory effort without tachypnea nor retractions. Breath sounds are clear Gastrointestinal: Soft and nontender. No distention.   Musculoskeletal: Nontender with normal range of motion in all extremities.  Neurologic:  Normal speech and language. No gross focal neurologic deficits  Skin:  Skin is warm, dry and  intact.  Psychiatric: Mood and affect are normal.   ____________________________________________    EKG  EKG reviewed and interpreted by myself shows a normal sinus rhythm at 95 bpm with a narrow QRS, normal axis, normal intervals, no  concerning ST changes  INITIAL IMPRESSION / ASSESSMENT AND PLAN / ED COURSE  Pertinent labs & imaging results that were available during my care of the patient were reviewed by me and considered in my medical decision making (see chart for details).  Patient presents the emergency department with rectal bleeding since last night and generalized weakness.  Rectal examination shows dark black stool strongly guaiac positive consistent with upper GI bleeding.  We will check labs, start the patient on IV Protonix, IV fluids.  Differential would include gastritis, gastric or peptic ulcer, variceal bleed, less likely lower GI bleed such as diverticular bleed.  As the patient is completely nontender with no pain complaints less likely colitis or diverticulitis.  Patient's lab work has resulted with a hemoglobin of 7.9 down approximately 1.5 points from last check.  Given the degree of dark stool strongly guaiac positive I have ordered a unit of blood to transfuse the patient.  I have verbally consented the patient we will have her sign written consent prior to transfusion.  Patient agreeable to plan of care.  I discussed patient with the hospitalist they will be admitting to their service.  CRITICAL CARE Performed by: Harvest Dark   Total critical care time: 30 minutes  Critical care time was exclusive of separately billable procedures and treating other patients.  Critical care was necessary to treat or prevent imminent or life-threatening deterioration.  Critical care was time spent personally by me on the following activities: development of treatment plan with patient and/or surrogate as well as nursing, discussions with consultants, evaluation of patient's response to treatment, examination of patient, obtaining history from patient or surrogate, ordering and performing treatments and interventions, ordering and review of laboratory studies, ordering and review of radiographic studies, pulse  oximetry and re-evaluation of patient's condition.   ____________________________________________   FINAL CLINICAL IMPRESSION(S) / ED DIAGNOSES  GI bleed    Harvest Dark, MD 12/04/17 [id number]

## 2017-12-05 ENCOUNTER — Inpatient Hospital Stay: Payer: Medicare HMO | Admitting: Anesthesiology

## 2017-12-05 ENCOUNTER — Encounter
Admission: EM | Disposition: A | Discharge status: Home / Self Care | Payer: Self-pay | Source: Home / Self Care | Attending: Internal Medicine

## 2017-12-05 ENCOUNTER — Encounter: Payer: Self-pay | Admitting: *Deleted

## 2017-12-05 DIAGNOSIS — K449 Diaphragmatic hernia without obstruction or gangrene: Secondary | ICD-10-CM

## 2017-12-05 DIAGNOSIS — K317 Polyp of stomach and duodenum: Secondary | ICD-10-CM

## 2017-12-05 DIAGNOSIS — K3189 Other diseases of stomach and duodenum: Secondary | ICD-10-CM

## 2017-12-05 LAB — HEMOGLOBIN A1C
HEMOGLOBIN A1C: 5.3 % (ref 4.8–5.6)
MEAN PLASMA GLUCOSE: 105.41 mg/dL

## 2017-12-05 LAB — CBC
HCT: 23.2 % — ABNORMAL LOW (ref 36.0–46.0)
Hemoglobin: 6.8 g/dL — ABNORMAL LOW (ref 12.0–15.0)
MCH: 25.9 pg — ABNORMAL LOW (ref 26.0–34.0)
MCHC: 29.3 g/dL — ABNORMAL LOW (ref 30.0–36.0)
MCV: 88.2 fL (ref 80.0–100.0)
NRBC: 0 % (ref 0.0–0.2)
PLATELETS: 245 10*3/uL (ref 150–400)
RBC: 2.63 MIL/uL — AB (ref 3.87–5.11)
RDW: 15.9 % — ABNORMAL HIGH (ref 11.5–15.5)
WBC: 5.7 10*3/uL (ref 4.0–10.5)

## 2017-12-05 LAB — HEMOGLOBIN AND HEMATOCRIT, BLOOD
HCT: 24.8 % — ABNORMAL LOW (ref 36.0–46.0)
HEMOGLOBIN: 7.4 g/dL — AB (ref 12.0–15.0)

## 2017-12-05 LAB — HEMOGLOBIN: Hemoglobin: 7.8 g/dL — ABNORMAL LOW (ref 12.0–15.0)

## 2017-12-05 LAB — BASIC METABOLIC PANEL
ANION GAP: 6 (ref 5–15)
BUN: 25 mg/dL — ABNORMAL HIGH (ref 8–23)
CALCIUM: 8.2 mg/dL — AB (ref 8.9–10.3)
CO2: 22 mmol/L (ref 22–32)
CREATININE: 0.84 mg/dL (ref 0.44–1.00)
Chloride: 110 mmol/L (ref 98–111)
GFR calc non Af Amer: >60 mL/min (ref >60)
Glucose, Bld: 105 mg/dL — ABNORMAL HIGH (ref 70–99)
Potassium: 3.8 mmol/L (ref 3.5–5.1)
SODIUM: 138 mmol/L (ref 135–145)

## 2017-12-05 SURGERY — EGD (ESOPHAGOGASTRODUODENOSCOPY)
Anesthesia: Monitor Anesthesia Care | Laterality: Left

## 2017-12-05 SURGERY — EGD (ESOPHAGOGASTRODUODENOSCOPY)
Anesthesia: General

## 2017-12-05 MED ORDER — LIDOCAINE HCL (PF) 2 % IJ SOLN
INTRAMUSCULAR | Status: AC
  Filled 2017-12-05: qty 10

## 2017-12-05 MED ORDER — PROPOFOL 10 MG/ML IV BOLUS
INTRAVENOUS | Status: AC
  Filled 2017-12-05: qty 20

## 2017-12-05 MED ORDER — PHENYLEPHRINE HCL 10 MG/ML IJ SOLN
INTRAMUSCULAR | Status: DC | PRN
  Administered 2017-12-05: 100 ug via INTRAVENOUS

## 2017-12-05 MED ORDER — SODIUM CHLORIDE 0.9 % IV SOLN: INTRAVENOUS | Status: DC

## 2017-12-05 MED ORDER — PANTOPRAZOLE SODIUM 40 MG IV SOLR
40.0000 mg | Freq: Two times a day (BID) | INTRAVENOUS | Status: DC
  Administered 2017-12-05 – 2017-12-06 (×3): 40 mg via INTRAVENOUS
  Filled 2017-12-05 (×3): qty 40

## 2017-12-05 MED ORDER — OXYCODONE-ACETAMINOPHEN 5-325 MG PO TABS
1.0000 | ORAL_TABLET | Freq: Four times a day (QID) | ORAL | Status: DC | PRN
  Administered 2017-12-05 (×2): 1 via ORAL
  Filled 2017-12-05 (×2): qty 1

## 2017-12-05 MED ORDER — LIDOCAINE HCL (CARDIAC) PF 100 MG/5ML IV SOSY
PREFILLED_SYRINGE | INTRAVENOUS | Status: DC | PRN
  Administered 2017-12-05: 100 mg via INTRAVENOUS

## 2017-12-05 MED ORDER — PROPOFOL 500 MG/50ML IV EMUL
INTRAVENOUS | Status: DC | PRN
  Administered 2017-12-05: 200 ug/kg/min via INTRAVENOUS

## 2017-12-05 MED ORDER — PROPOFOL 10 MG/ML IV BOLUS
INTRAVENOUS | Status: DC | PRN
  Administered 2017-12-05: 50 mg via INTRAVENOUS

## 2017-12-05 NOTE — Transfer of Care (Signed)
Immediate Anesthesia Transfer of Care Note  Patient: Veronica Moon  Procedure(s) Performed: ESOPHAGOGASTRODUODENOSCOPY (EGD) (N/A )  Patient Location: PACU and Endoscopy Unit  Anesthesia Type:MAC  Level of Consciousness: awake, alert  and oriented  Airway & Oxygen Therapy: Patient Spontanous Breathing and Patient connected to nasal cannula oxygen  Post-op Assessment: Report given to RN and Post -op Vital signs reviewed and stable  Post vital signs: Reviewed and stable  Last Vitals:  Vitals Value Taken Time  BP 116/43 12/05/2017  8:19 AM  Temp 36.1 C 12/05/2017  8:19 AM  Pulse 60 12/05/2017  8:19 AM  Resp 10 12/05/2017  8:19 AM  SpO2 100 % 12/05/2017  8:19 AM    Last Pain:  Vitals:   12/05/17 0819  TempSrc: Tympanic  PainSc:          Complications: No apparent anesthesia complications

## 2017-12-05 NOTE — Anesthesia Preprocedure Evaluation (Addendum)
Anesthesia Evaluation  Patient identified by MRN, date of birth, ID band Patient awake    Reviewed: Allergy & Precautions, H&P , NPO status , Patient's Chart, lab work & pertinent test results  History of Anesthesia Complications Negative for: history of anesthetic complications  Airway Mallampati: III  TM Distance: <3 FB Neck ROM: limited    Dental  (+) Chipped, Missing, Poor Dentition, Edentulous Upper   Pulmonary neg shortness of breath, sleep apnea ,           Cardiovascular Exercise Tolerance: Good hypertension, (-) angina(-) Past MI and (-) DOE      Neuro/Psych negative neurological ROS  negative psych ROS   GI/Hepatic Neg liver ROS, PUD, GERD  Medicated and Controlled,  Endo/Other  negative endocrine ROS  Renal/GU negative Renal ROS  negative genitourinary   Musculoskeletal   Abdominal   Peds  Hematology negative hematology ROS (+)   Anesthesia Other Findings Past Medical History: No date: Blood transfusion without reported diagnosis No date: Cancer [facility])     Comment:  right leg 10/26/17 No date: Colon polyps No date: Gastritis No date: GIB (gastrointestinal bleeding) No date: Hypertension  Past Surgical History: No date: CHOLECYSTECTOMY 07/03/2015: ESOPHAGOGASTRODUODENOSCOPY (EGD) WITH PROPOFOL; N/A     Comment:  Procedure: ESOPHAGOGASTRODUODENOSCOPY (EGD) WITH               PROPOFOL;  Surgeon: Lucilla Lame, MD;  Location: [facility]               ENDOSCOPY;  Service: Endoscopy;  Laterality: N/A;  BMI    Body Mass Index:  37.25 kg/m      Reproductive/Obstetrics negative OB ROS                             Anesthesia Physical Anesthesia Plan  ASA: III  Anesthesia Plan: General   Post-op Pain Management:    Induction: Intravenous  PONV Risk Score and Plan: Propofol infusion and TIVA  Airway Management Planned: Natural Airway and Nasal Cannula  Additional Equipment:    Intra-op Plan:   Post-operative Plan:   Informed Consent: I have reviewed the patients History and Physical, chart, labs and discussed the procedure including the risks, benefits and alternatives for the proposed anesthesia with the patient or authorized representative who has indicated his/her understanding and acceptance.   Dental Advisory Given  Plan Discussed with: Anesthesiologist, CRNA and Surgeon  Anesthesia Plan Comments: (Plan to suspend DNR for procedure.  Patient and family voiced understanding.  Patient consented for risks of anesthesia including but not limited to:  - adverse reactions to medications - risk of intubation if required - damage to teeth, lips or other oral mucosa - sore throat or hoarseness - Damage to heart, brain, lungs or loss of life  Patient voiced understanding.)        Anesthesia Quick Evaluation

## 2017-12-05 NOTE — Anesthesia Post-op Follow-up Note (Signed)
Anesthesia QCDR form completed.        

## 2017-12-05 NOTE — Progress Notes (Signed)
[place] at [place] NAME: Veronica Moon    MR#:  [id number]  DATE OF BIRTH:  [birth year]  SUBJECTIVE:  CHIEF COMPLAINT:   Chief Complaint  Patient presents with  . Melena   The patient has no complaints. REVIEW OF SYSTEMS:  Review of Systems  Constitutional: Negative for chills, fever and malaise/fatigue.  HENT: Negative for sore throat.   Eyes: Negative for blurred vision and double vision.  Respiratory: Negative for cough, hemoptysis, shortness of breath, wheezing and stridor.   Cardiovascular: Negative for chest pain, palpitations, orthopnea and leg swelling.  Gastrointestinal: Negative for abdominal pain, blood in stool, diarrhea, melena, nausea and vomiting.  Genitourinary: Negative for dysuria, flank pain and hematuria.  Musculoskeletal: Negative for back pain and joint pain.  Skin: Negative for rash.  Neurological: Negative for dizziness, sensory change, focal weakness, seizures, loss of consciousness, weakness and headaches.  Endo/Heme/Allergies: Negative for polydipsia.  Psychiatric/Behavioral: Negative for depression. The patient is not nervous/anxious.     DRUG ALLERGIES:   Allergies  Allergen Reactions  . Demerol [Meperidine] Itching  . Codeine Itching   VITALS:  Blood pressure (!) 160/63, pulse 72, temperature 98.4 F (36.9 C), temperature source Oral, resp. rate 15, height 5\' 8"  (1.727 m), weight 111.1 kg, SpO2 100 %. PHYSICAL EXAMINATION:  Physical Exam  Constitutional: She is oriented to person, place, and time. No distress.  Obesity.  HENT:  Head: Normocephalic.  Mouth/Throat: Oropharynx is clear and moist.  Eyes: Pupils are equal, round, and reactive to light. Conjunctivae and EOM are normal. No scleral icterus.  Neck: Normal range of motion. Neck supple. No JVD present. No tracheal deviation present.  Cardiovascular: Normal rate, regular rhythm and normal heart sounds. Exam reveals no gallop.  No  murmur heard. Pulmonary/Chest: Effort normal and breath sounds normal. No respiratory distress. She has no wheezes. She has no rales.  Abdominal: Soft. Bowel sounds are normal. She exhibits no distension. There is no tenderness. There is no rebound.  Musculoskeletal: Normal range of motion. She exhibits no edema or tenderness.  Neurological: She is alert and oriented to person, place, and time. No cranial nerve deficit.  Skin: No rash noted. No erythema.   LABORATORY PANEL:  Female CBC Recent Labs  Lab 12/05/17 0555 12/05/17 0651  WBC 5.7  --   HGB 6.8* 7.4*  HCT 23.2* 24.8*  PLT 245  --    ------------------------------------------------------------------------------------------------------------------ Chemistries  Recent Labs  Lab 12/04/17 0819 12/05/17 0555  NA 136 138  K 4.7 3.8  CL 105 110  CO2 23 22  GLUCOSE 145* 105*  BUN 44* 25*  CREATININE 0.85 0.84  CALCIUM 8.6* 8.2*  AST 24  --   ALT 13  --   ALKPHOS 67  --   BILITOT 0.8  --    RADIOLOGY:  No results found. ASSESSMENT AND PLAN:   Melena- likely gastritis vs PUD, given her history.  Last EGD was in 2017 and showed non-bleeding gastric ulcers. No NSAID use. The patient has been treated with IV Protonix, n.p.o. with IV fluids support. EGD today show gastric erosion.  Clear liquid diet. Per Dr. Bonna Gains, If patient has any further signs of bleeding or drop in Hemoglobin, she would need a colonoscopy for further evaluation, continue Serial CBCs and transfuse PRN.  Acute blood loss anemia-due to above. -Received 1 unit pRBCs in the ED -Monitor H/H, PRBC transfusion PRN.  Hypertension- BPs elevated in the ED -Continue home hydrochlorothiazide -Hydralazine  PRN  Hyperglycemia- glucose 145 in the ED -Check A1c: 5.3.  Obesity.  Follow-up PCP.  All the records are reviewed and case discussed with Care Management/Social Worker. Management plans discussed with the patient, her daughter and they are in  agreement.  CODE STATUS: Partial Code  TOTAL TIME TAKING CARE OF THIS PATIENT: 28 minutes.   More than 50% of the time was spent in counseling/coordination of care: YES  POSSIBLE D/C IN 1-2 DAYS, DEPENDING ON CLINICAL CONDITION.   Demetrios Loll M.D on 12/05/2017 at 2:43 PM  Between 7am to 6pm - Pager - [phone]  After 6pm go to [email or web address] - [facility]

## 2017-12-05 NOTE — Op Note (Signed)
[name] Procedure Date: 12/05/2017 7:54 AM MRN: [id number] Account #: [id number] Date of Birth: April 15, 1945 Admit Type: Inpatient Age: 72 Room: [facility] ENDO ROOM 4 Gender: Female Note Status: Finalized Procedure:            Upper GI endoscopy Indications:          Melena Providers:            Ardith Lewman B. Bonna Gains MD, MD Referring MD:         Forest Gleason Md, MD (Referring MD) Medicines:            Monitored Anesthesia Care Complications:        No immediate complications. Procedure:            Pre-Anesthesia Assessment:                       - The risks and benefits of the procedure and the                        sedation options and risks were discussed with the                        patient. All questions were answered and informed                        consent was obtained.                       - Patient identification and proposed procedure were                        verified prior to the procedure.                       - ASA Grade Assessment: II - A patient with mild                        systemic disease.                       After obtaining informed consent, the endoscope was                        passed under direct vision. Throughout the procedure,                        the patient's blood pressure, pulse, and oxygen                        saturations were monitored continuously. The Endoscope                        was introduced through the mouth, and advanced to the                        second part of duodenum. The upper GI endoscopy was                        accomplished with ease. The patient tolerated the  procedure well. Findings:      The gastroesophageal junction and examined esophagus were normal.      A few, 7 to 10 mm non-bleeding erosions were found in the gastric body.       There were no stigmata of recent bleeding. Biopsies were taken with a       cold  forceps for histology. These were linear in appearance and were       extending from the hiatal hernia to immediately below it as well.       (Comparing her previous endoscopy images from 2017 to now, they appear       very similar to the ones seen in 2017).      A 2 cm hiatal hernia was present.      A single 4 mm sessile polyp with no bleeding and no stigmata of recent       bleeding was found in the gastric body. Biopsies were taken with a cold       forceps for histology.      The gastric fundus, incisura and gastric antrum were normal.      There is no endoscopic evidence of bleeding in the entire examined       stomach. Biopsies were obtained in the gastric body, at the incisura and       in the gastric antrum with cold forceps for Helicobacter pylori testing.      The examined duodenum was normal. Impression:           - Normal gastroesophageal junction and esophagus.                       - Non-bleeding erosive gastropathy. Biopsied.                       - The gastric erosions were previously seen in 2017 as                        well. This is the likely source of her bleeding on this                        admission as well.                       - 2 cm hiatal hernia.                       - A single gastric polyp. Biopsied.                       - Normal gastric fundus, incisura and antrum.                       - Normal examined duodenum.                       - Biopsies were obtained in the gastric body, at the                        incisura and in the gastric antrum. Recommendation:       - Clear liquid diet today.                       - Check iron studies today and  replace with IV iron if                        low                       - Continue Serial CBCs and transfuse PRN                       - If patient has any further signs of bleeding or drop                        in Hemoglobin, she would need a colonoscopy for further                        evaluation. If  no further signs of bleeding, continue                        management with PPI for above.                       - Avoid NSAIDs except Aspirin if medically indicated                       - Change PPI to PO BID from IV BID tomorrow if no                        further signs of active GI bleeding.                       - If her gastric erosions do not improve with PPI on                        evaluation with repeat EGD surgery referral for hiatal                        hernia repair may need to be considered. This can be                        discussed in GI clinic                       - The findings and recommendations were discussed with                        the patient.                       - The findings and recommendations were discussed with                        the patient's family.                       - Return to my office in 2 weeks.                       - Continue present medications.                       - Return to primary care physician in 2 weeks. Procedure Code(s):    ---  Professional ---                       [phone], Esophagogastroduodenoscopy, flexible, transoral;                        with biopsy, single or multiple Diagnosis Code(s):    --- Professional ---                       K31.89, Other diseases of stomach and duodenum                       K44.9, Diaphragmatic hernia without obstruction or                        gangrene                       K31.7, Polyp of stomach and duodenum                       K92.1, Melena (includes Hematochezia) CPT copyright 2018 American [employer] Association. All rights reserved. The codes documented in this report are preliminary and upon coder review may  be revised to meet current compliance requirements.  Vonda Antigua, MD Margretta Sidle B. Bonna Gains MD, MD 12/05/2017 8:31:28 AM This report has been signed electronically. Number of Addenda: 0 Note Initiated On: 12/05/2017 7:54 AM Estimated Blood Loss: Estimated blood  loss: none.      [facility]

## 2017-12-05 NOTE — Progress Notes (Signed)
Pt hgb 6.8 this AM. Primary nurse paged and spoke to Dr. Duane Boston. Orders received for H&H. Primary nurse to continue to monitor.

## 2017-12-05 NOTE — Progress Notes (Addendum)
Veronica Antigua, MD [address], [place], [place], [place], [address] [address], [place], [place], [place], [address] Phone: [phone]  Fax: [phone]   Subjective: Patient denies any further bowel movements in over 24 hours.  No abdominal pain.  No nausea or vomiting   Objective: Exam: Vital signs in last 24 hours: Vitals:   12/04/17 1354 12/04/17 2003 12/05/17 0514 12/05/17 0724  BP: (!) 156/69 (!) 151/50 (!) 150/61 (!) 153/69  Pulse: 87 87 76 76  Resp: 17 16 16 16   Temp: 98.1 F (36.7 C) 98.2 F (36.8 C) (!) 97.5 F (36.4 C) (!) 96 F (35.6 C)  TempSrc: Oral Oral Oral Tympanic  SpO2: 100% 97% 100% 100%  Weight:    111.1 kg  Height:    5\' 8"  (1.727 m)   Weight change:   Intake/Output Summary (Last 24 hours) at 12/05/2017 0800 Last data filed at 12/05/2017 0636 Gross per 24 hour  Intake 3345.44 ml  Output 700 ml  Net 2645.44 ml    General: No acute distress, AAO x3 Abd: Soft, NT/ND, No HSM Skin: Warm, no rashes Neck: Supple, Trachea midline   Lab Results: Lab Results  Component Value Date   WBC 5.7 12/05/2017   HGB 7.4 (L) 12/05/2017   HCT 24.8 (L) 12/05/2017   MCV 88.2 12/05/2017   PLT 245 12/05/2017   Micro Results: No results found for this or any previous visit (from the past 240 hour(s)). Studies/Results: No results found. Medications:  Scheduled Meds: . [MAR Hold] cholecalciferol  1,000 Units Oral Daily  . [MAR Hold] hydrochlorothiazide  25 mg Oral Daily  . [MAR Hold] vitamin B-12  1,000 mcg Oral Daily  . [MAR Hold] vitamin C  500 mg Oral Daily   Continuous Infusions: . sodium chloride 150 mL/hr at 12/05/17 0300  . sodium chloride    . pantoprozole (PROTONIX) infusion 8 mg/hr (12/05/17 0300)   PRN Meds:.[MAR Hold] acetaminophen **OR** [MAR Hold] acetaminophen, [MAR Hold] hydrALAZINE, [MAR Hold] ondansetron **OR** [MAR Hold] ondansetron (ZOFRAN) IV   Assessment: Active Problems:   Melena    Plan: Family at  bedside today We will proceed with EGD to evaluate for etiology of upper GI bleed Continue PPI IV twice daily Continue serial CBCs Hemoglobin dropped today to 6.8 this morning despite 1 unit packed red blood cell transfusion yesterday, however, it was repeated and repeat was 7.4 without any further transfusions.  Therefore, the drop to 6.8 may be a lab variation.  Would recommend monitoring CBC closely today and if any further drop occurs, would recommend PRBC transfusion  I have discussed alternative options, risks & benefits,  which include, but are not limited to, bleeding, infection, perforation,respiratory complication & drug reaction.  The patient agrees with this plan & written consent will be obtained.    (Risks of PPI use were discussed with patient including bone loss, C. Diff diarrhea, pneumonia, infections, CKD, electrolyte abnormalities.  If clinically possible based on symptoms, goal would be to maintain patient on the lowest dose possible, or discontinue the medication with institution of acid reflux lifestyle modifications over time. Pt. Verbalizes understanding and chooses to continue the medication.)  Addendum: Patient informed as after her HD procedure today that she has been taking Alka-Seltzer plus cold recently, and this has aspirin in it.  This explains her gastric erosions seen on EGD today.  See EGD report for findings in details.  Avoid all NSAIDs, except for low-dose aspirin if medically indicated by primary team  or primary care provider.  Patient family were asked to always read ingredients prior to taking any over-the-counter products to avoid NSAIDs given that she has had NSAID induced erosions in the past.    LOS: 1 day   Veronica Antigua, MD 12/05/2017, 8:00 AM

## 2017-12-05 NOTE — Anesthesia Postprocedure Evaluation (Signed)
Anesthesia Post Note  Patient: Veronica Moon  Procedure(s) Performed: ESOPHAGOGASTRODUODENOSCOPY (EGD) (N/A )  Patient location during evaluation: Endoscopy Anesthesia Type: General Level of consciousness: awake and alert Pain management: pain level controlled Vital Signs Assessment: post-procedure vital signs reviewed and stable Respiratory status: spontaneous breathing, nonlabored ventilation, respiratory function stable and patient connected to nasal cannula oxygen Cardiovascular status: blood pressure returned to baseline and stable Postop Assessment: no apparent nausea or vomiting Anesthetic complications: no     Last Vitals:  Vitals:   12/05/17 0835 12/05/17 0842  BP: (!) 119/44 (!) 113/58  Pulse: 71 71  Resp: 15 16  Temp:    SpO2:      Last Pain:  Vitals:   12/05/17 0835  TempSrc:   PainSc: 1                  [name]

## 2017-12-06 LAB — HEMOGLOBIN
HEMOGLOBIN: 6.4 g/dL — AB (ref 12.0–15.0)
Hemoglobin: 7.7 g/dL — ABNORMAL LOW (ref 12.0–15.0)
Hemoglobin: 7 g/dL — ABNORMAL LOW (ref 12.0–15.0)

## 2017-12-06 LAB — PREPARE RBC (CROSSMATCH)

## 2017-12-06 MED ORDER — METOPROLOL TARTRATE 25 MG PO TABS: 12.5000 mg | ORAL_TABLET | Freq: Two times a day (BID) | ORAL | 0 refills | Status: DC

## 2017-12-06 MED ORDER — FERROUS SULFATE 325 (65 FE) MG PO TBEC: 325.0000 mg | DELAYED_RELEASE_TABLET | Freq: Two times a day (BID) | ORAL | 0 refills | Status: AC

## 2017-12-06 MED ORDER — PANTOPRAZOLE SODIUM 40 MG PO TBEC: 40.0000 mg | DELAYED_RELEASE_TABLET | Freq: Two times a day (BID) | ORAL | 0 refills | Status: DC

## 2017-12-06 MED ORDER — METOPROLOL TARTRATE 25 MG PO TABS
12.5000 mg | ORAL_TABLET | Freq: Two times a day (BID) | ORAL | Status: DC
  Administered 2017-12-06: 12.5 mg via ORAL
  Filled 2017-12-06: qty 1

## 2017-12-06 MED ORDER — SODIUM CHLORIDE 0.9% IV SOLUTION: Freq: Once | INTRAVENOUS | Status: DC

## 2017-12-06 MED ORDER — HYDROCHLOROTHIAZIDE 25 MG PO TABS: 25.0000 mg | ORAL_TABLET | Freq: Every day | ORAL | 0 refills | Status: DC

## 2017-12-06 NOTE — Progress Notes (Signed)
  Lucilla Lame, MD [facility]   [address], [place], [place] [address] Phone: [phone] Fax : [phone]   Subjective: The patient was found to have a low blood count on morning labs and it was repeated which came back up to her previous level.  She was admitted with melena and was found to have NSAID induced erosive gastritis.  The patient denies any abdominal pain nausea vomiting fevers or chills.   Objective: Vital signs in last 24 hours: Vitals:   12/05/17 1409 12/05/17 2154 12/06/17 0349 12/06/17 1300  BP: (!) 160/63 (!) 159/60 (!) 155/64 (!) 155/62  Pulse: 72 83 75 77  Resp: 15 18 16 17   Temp: 98.4 F (36.9 C) 98.3 F (36.8 C) 97.8 F (36.6 C) 98 F (36.7 C)  TempSrc: Oral Oral Oral Oral  SpO2: 100% 99% 99% 98%  Weight:      Height:       Weight change: -0.001 kg  Intake/Output Summary (Last 24 hours) at 12/06/2017 1634 Last data filed at 12/06/2017 1010 Gross per 24 hour  Intake 1404.03 ml  Output 2400 ml  Net -995.97 ml     Exam: Heart:: Regular rate and rhythm, S1S2 present or without murmur or extra heart sounds Lungs: normal and clear to auscultation and percussion Abdomen: soft, nontender, normal bowel sounds   Lab Results: @LABTEST2 @ Micro Results: No results found for this or any previous visit (from the past 240 hour(s)). Studies/Results: No results found. Medications: I have reviewed the patient's current medications. Scheduled Meds: . cholecalciferol  1,000 Units Oral Daily  . hydrochlorothiazide  25 mg Oral Daily  . metoprolol tartrate  12.5 mg Oral BID  . vitamin B-12  1,000 mcg Oral Daily  . vitamin C  500 mg Oral Daily   Continuous Infusions: PRN Meds:.acetaminophen **OR** acetaminophen, hydrALAZINE, ondansetron **OR** ondansetron (ZOFRAN) IV, oxyCODONE-acetaminophen   Assessment: Active Problems:   Melena    Plan: This patient had NSAID induced gastric erosions.  The patient's hemoglobin has been stable without any  further sign of bleeding.  The patient has been given my card and has been told to follow-up as an outpatient.  Nothing further to do from a GI point of view. I will sign off.  Please call if any further GI concerns or questions.  We would like to thank you for the opportunity to participate in the care of [place].    LOS: 2 days   Lucilla Lame 12/06/2017, 4:34 PM

## 2017-12-06 NOTE — Progress Notes (Signed)
MD paged to make him aware of pt's hemoglobin of 6.4 this morning.

## 2017-12-06 NOTE — Evaluation (Addendum)
Physical Therapy Evaluation Patient Details Name: Veronica Moon MRN: [id number] DOB: [birth year] Today's Date: 12/06/2017   History of Present Illness  72 y.o. female with a known history of HTN, gastritis, and PUD who presented to the ED with melena.   Clinical Impression  Patient A&Ox4 at start of session with no complaints of pain. Reported living alone in 1 story house, independent with ADLs/IADLs, community ambulator, drives. Does admit intermittently using RW in home at night to safely ambulate to bathroom. Reported that she has daughters who can assist/supervise as needed.  Upon assessment patient demonstrated transfers mod I, ambulated in hallway with RW and supervision/mod I ~161ft. Stated she felt more safe with use of RW.  Mild complaints of fatigue at end of mobility, but steady. The patient demonstrated near return to PLOF, but would benefit from outpatient PT to address minimal change in mobility (use of RW), mild deficits in strength/endurance as well.     Follow Up Recommendations Outpatient PT    Equipment Recommendations  None recommended by PT;Other (comment)(Pt reported that she has a RW at home.)    Recommendations for Other Services       Precautions / Restrictions Precautions Precautions: Fall Restrictions Weight Bearing Restrictions: No      Mobility  Bed Mobility               General bed mobility comments: deferred. Pt standing up at start of session at sink. Asked to sit to allow PT to adjust room and RW.  Transfers Overall transfer level: Modified independent Equipment used: Rolling walker (2 wheeled)                Ambulation/Gait Ambulation/Gait assistance: Supervision;Modified independent (Device/Increase time) Gait Distance (Feet): 100 Feet Assistive device: Rolling walker (2 wheeled) Gait Pattern/deviations: Step-through pattern Gait velocity: decreased   General Gait Details: no LOB noted. Some fatigue but able to  complete, not SOB at end of session.  Stairs            Wheelchair Mobility    Modified Rankin (Stroke Patients Only)       Balance Overall balance assessment: Mild deficits observed, not formally tested                                           Pertinent Vitals/Pain Pain Assessment: No/denies pain    Home Living Family/patient expects to be discharged to:: Private residence Living Arrangements: Alone Available Help at Discharge: Family;Available PRN/intermittently Type of Home: House Home Access: Stairs to enter Entrance Stairs-Rails: Right Entrance Stairs-Number of Steps: 6 Home Layout: One level Home Equipment: Walker - 2 wheels;Grab bars - toilet      Prior Function Level of Independence: Independent         Comments: independent with ADLs/IADLS. Does report using RW at night intermittently.      Hand Dominance        Extremity/Trunk Assessment   Upper Extremity Assessment Upper Extremity Assessment: RUE deficits/detail;LUE deficits/detail RUE Deficits / Details: grossly 4/5 LUE Deficits / Details: grossly 4/5    Lower Extremity Assessment Lower Extremity Assessment: RLE deficits/detail;LLE deficits/detail RLE Deficits / Details: grossly 4/5 LLE Deficits / Details: grossly 4/5       Communication   Communication: No difficulties  Cognition Arousal/Alertness: Awake/alert Behavior During Therapy: WFL for tasks assessed/performed Overall Cognitive Status: Within Functional Limits for tasks assessed  General Comments      Exercises     Assessment/Plan    PT Assessment Patient needs continued PT services  PT Problem List Decreased strength;Decreased activity tolerance;Decreased balance;Decreased mobility       PT Treatment Interventions DME instruction;Balance training;Gait training;Neuromuscular re-education;Stair training;Functional mobility  training;Patient/family education;Therapeutic activities;Therapeutic exercise    PT Goals (Current goals can be found in the Care Plan section)  Acute Rehab PT Goals Patient Stated Goal: to return to PLOF PT Goal Formulation: With patient Time For Goal Achievement: 12/20/17 Potential to Achieve Goals: Good    Frequency Min 2X/week   Barriers to discharge        Co-evaluation               AM-PAC PT "6 Clicks" Daily Activity  Outcome Measure Difficulty turning over in bed (including adjusting bedclothes, sheets and blankets)?: None Difficulty moving from lying on back to sitting on the side of the bed? : None Difficulty sitting down on and standing up from a chair with arms (e.g., wheelchair, bedside commode, etc,.)?: A Little Help needed moving to and from a bed to chair (including a wheelchair)?: None Help needed walking in hospital room?: None Help needed climbing 3-5 steps with a railing? : A Little 6 Click Score: 22    End of Session Equipment Utilized During Treatment: Gait belt Activity Tolerance: Patient tolerated treatment well Patient left: in chair;with family/visitor present;with call bell/phone within reach;Other (comment)(No chair alarm box in room, nursing notified.) Nurse Communication: Mobility status PT Visit Diagnosis: Difficulty in walking, not elsewhere classified (R26.2);Other abnormalities of gait and mobility (R26.89)    Time: 4097-3532 PT Time Calculation (min) (ACUTE ONLY): 18 min   Charges:   PT Evaluation $PT Eval Low Complexity: 1 Low          Lieutenant Diego PT, DPT 12:19 PM,12/06/17 [phone]

## 2017-12-06 NOTE — Discharge Instructions (Signed)
Resume diet and activity as before ° ° °

## 2017-12-06 NOTE — Progress Notes (Signed)
Discharge order received. Patient is alert and oriented. Vital signs stable . No signs of acute distress. Discharge instructions given. Patient verbalized understanding. No other issues noted at this time.   

## 2017-12-07 LAB — H PYLORI, IGM, IGG, IGA AB: H Pylori IgG: 0.24 Index Value (ref 0.00–0.79)

## 2017-12-07 LAB — SURGICAL PATHOLOGY

## 2017-12-07 NOTE — Discharge Summary (Signed)
[place] [facility] [place] NAME: Veronica Moon    MR#:  [id number]  DATE OF BIRTH:  26-Nov-1945  DATE OF ADMISSION:  12/04/2017 ADMITTING PHYSICIAN: Sela Hua, MD  DATE OF DISCHARGE: 12/06/2017  6:45 PM  PRIMARY CARE PHYSICIAN: Patient, No Pcp Per   ADMISSION DIAGNOSIS:  Gastrointestinal hemorrhage, unspecified gastrointestinal hemorrhage type [K92.2]  DISCHARGE DIAGNOSIS:  Active Problems:   Melena   SECONDARY DIAGNOSIS:   Past Medical History:  Diagnosis Date  . Blood transfusion without reported diagnosis   . Cancer ([place])    right leg 10/26/17  . Colon polyps   . Gastritis   . GIB (gastrointestinal bleeding)   . Hypertension      ADMITTING HISTORY  HISTORY OF PRESENT ILLNESS:  Veronica Moon  is a 72 y.o. female with a known history of HTN, gastritis, and PUD who presented to the ED with melena that started yesterday afternoon.  She reports 3 dark, black bowel movement since yesterday.  She then began having weakness and lightheadedness.  She denies any abdominal pain or vomiting.  No bright red blood per rectum.  She has a history of recurrent melena due to peptic ulcer disease.  She was last hospitalized in 2017 for this.  She does not take any NSAIDs at home.  In the ED, hemoglobin was 7.9.  She was transfused 1 unit packed red blood cells.  Hospitalists were called for admission.   HOSPITAL COURSE:    *Acute blood loss anemia secondary to upper GI bleed.  EGD showed gastric erosions which is likely source.  Was on IV Protonix.  Had 1 unit packed RBC transfusion.  Hemoglobin stabilized with no further bleeding found.  Patient is being changed to twice daily oral PPIs along with iron supplements and will be discharged home.  GI signed off and cleared for discharge prior to discharge.  *Hypertension.  Has not been taking medications at home.  Started on hydrochlorothiazide and metoprolol that she was on in the past.   Improved.  Stable for discharge back home to follow-up with primary care physician.   CONSULTS OBTAINED:    DRUG ALLERGIES:   Allergies  Allergen Reactions  . Demerol [Meperidine] Itching  . Codeine Itching    DISCHARGE MEDICATIONS:   Allergies as of 12/06/2017      Reactions   Demerol [meperidine] Itching   Codeine Itching      Medication List    TAKE these medications   cholecalciferol 25 MCG (1000 UT) tablet Commonly known as:  VITAMIN D3 Take 1,000 Units by mouth daily.   ferrous sulfate 325 (65 FE) MG EC tablet Take 1 tablet (325 mg total) by mouth 2 (two) times daily.   hydrochlorothiazide 25 MG tablet Commonly known as:  HYDRODIURIL Take 1 tablet (25 mg total) by mouth daily.   lidocaine 5 % Commonly known as:  LIDODERM Place 1 patch onto the skin daily. Remove & Discard patch within 12 hours or as directed by MD   metoprolol tartrate 25 MG tablet Commonly known as:  LOPRESSOR Take 0.5 tablets (12.5 mg total) by mouth 2 (two) times daily.   pantoprazole 40 MG tablet Commonly known as:  PROTONIX Take 40 mg by mouth daily.   pantoprazole 40 MG tablet Commonly known as:  PROTONIX Take 1 tablet (40 mg total) by mouth 2 (two) times daily before a meal.   vitamin B-12 1000 MCG tablet Commonly known as:  CYANOCOBALAMIN Take 1,000 mcg  by mouth daily.   vitamin C 500 MG tablet Commonly known as:  ASCORBIC ACID Take 500 mg by mouth daily.       Today   VITAL SIGNS:  Blood pressure (!) 155/62, pulse 77, temperature 98 F (36.7 C), temperature source Oral, resp. rate 17, height 5\' 8"  (1.727 m), weight 111.1 kg, SpO2 98 %.  I/O:  No intake or output data in the 24 hours ending 12/07/17 1321  PHYSICAL EXAMINATION:  Physical Exam  GENERAL:  72 y.o.-year-old patient lying in the bed with no acute distress.  LUNGS: Normal breath sounds bilaterally, no wheezing, rales,rhonchi or crepitation. No use of accessory muscles of respiration.   CARDIOVASCULAR: S1, S2 normal. No murmurs, rubs, or gallops.  ABDOMEN: Soft, non-tender, non-distended. Bowel sounds present. No organomegaly or mass.  NEUROLOGIC: Moves all 4 extremities. PSYCHIATRIC: The patient is alert and oriented x 3.  SKIN: No obvious rash, lesion, or ulcer.   DATA REVIEW:   CBC Recent Labs  Lab 12/05/17 0555 12/05/17 0651  12/06/17 1526  WBC 5.7  --   --   --   HGB 6.8* 7.4*   < > 7.0*  HCT 23.2* 24.8*  --   --   PLT 245  --   --   --    < > = values in this interval not displayed.    Chemistries  Recent Labs  Lab 12/04/17 0819 12/05/17 0555  NA 136 138  K 4.7 3.8  CL 105 110  CO2 23 22  GLUCOSE 145* 105*  BUN 44* 25*  CREATININE 0.85 0.84  CALCIUM 8.6* 8.2*  AST 24  --   ALT 13  --   ALKPHOS 67  --   BILITOT 0.8  --     Cardiac Enzymes No results for input(s): TROPONINI in the last 168 hours.  Microbiology Results  No results found for this or any previous visit.  RADIOLOGY:  No results found.  Follow up with PCP in 1 week.  Management plans discussed with the patient, family and they are in agreement.  CODE STATUS:  Code Status History    Date Active Date Inactive Code Status Order ID Comments User Context   12/04/2017 1353 12/06/2017 2150 Partial Code [id number]  Sela Hua, MD Inpatient   07/02/2015 1953 07/06/2015 1358 Full Code [id number]  Demetrios Loll, MD Inpatient    Questions for Most Recent Historical Code Status (Order [id number])    Question Answer Comment   In the event of cardiac or respiratory ARREST: Initiate Code Blue, Call Rapid Response Yes    In the event of cardiac or respiratory ARREST: Perform CPR Yes    In the event of cardiac or respiratory ARREST: Perform Intubation/Mechanical Ventilation No    In the event of cardiac or respiratory ARREST: Use NIPPV/BiPAp only if indicated Yes    In the event of cardiac or respiratory ARREST: Administer ACLS medications if indicated Yes    In the event of cardiac or  respiratory ARREST: Perform Defibrillation or Cardioversion if indicated Yes       TOTAL TIME TAKING CARE OF THIS PATIENT ON DAY OF DISCHARGE: more than 30 minutes.   Veronica Moon M.D on 12/07/2017 at 1:21 PM  Between 7am to 6pm - Pager - [phone]  After 6pm go to [email or web address] - password [email or web address] [facility]  Office  [phone]  CC: Primary care physician; Patient, No Pcp Per  Note: This dictation was prepared with Dragon  dictation along with smaller phrase technology. Any transcriptional errors that result from this process are unintentional.

## 2017-12-08 LAB — TYPE AND SCREEN
ABO/RH(D): A POS
ANTIBODY SCREEN: NEGATIVE
UNIT DIVISION: 0
UNIT DIVISION: 0

## 2017-12-08 LAB — BPAM RBC
Blood Product Expiration Date: 201912032359
Blood Product Expiration Date: 201912042359
ISSUE DATE / TIME: 201911091005
Unit Type and Rh: 6200
Unit Type and Rh: 6200

## 2017-12-09 ENCOUNTER — Encounter: Payer: Self-pay | Admitting: Gastroenterology

## 2017-12-20 ENCOUNTER — Ambulatory Visit: Payer: Medicare HMO | Admitting: Gastroenterology

## 2017-12-20 ENCOUNTER — Encounter: Payer: Self-pay | Admitting: Gastroenterology

## 2017-12-20 VITALS — BP 159/86 | HR 105 | Ht 68.0 in | Wt 248.4 lb

## 2017-12-20 DIAGNOSIS — K259 Gastric ulcer, unspecified as acute or chronic, without hemorrhage or perforation: Secondary | ICD-10-CM

## 2017-12-20 DIAGNOSIS — Z1211 Encounter for screening for malignant neoplasm of colon: Secondary | ICD-10-CM | POA: Diagnosis not present

## 2017-12-20 MED ORDER — PANTOPRAZOLE SODIUM 40 MG PO TBEC: 40.0000 mg | DELAYED_RELEASE_TABLET | Freq: Every day | ORAL | 1 refills | Status: DC

## 2017-12-20 NOTE — Progress Notes (Signed)
Pt has an appt in February to see Gaetano Net, at [facility] in [facility],  to set up her PCP.

## 2017-12-20 NOTE — Progress Notes (Signed)
Vonda Antigua, MD [address]  [place], [place] [address]  Main: [phone]  Fax: [phone]   Primary Care Physician: Patient, No Pcp Per  Primary Gastroenterologist:  Dr. Vonda Antigua  Chief Complaint  Patient presents with  . Follow-up    hospital f/u for upper GI bleed  . New Patient (Initial Visit)    HPI: Veronica Moon is a 72 y.o. female here for follow-up of hospitalization for GI bleed and EGD showing nonbleeding erosions, 7 to 10 mm in the gastric body.  Patient denies any further melena at home.  No abdominal pain.  No nausea or vomiting.  No dysphagia or heartburn.  Taking PPI twice daily.  No further NSAID use.  Patient was hospitalized in November 2019 and underwent EGD on December 05, 2017.  Patient reported Alka-Seltzer cold use, which has aspirin in it, prior to the hospitalization.  She did not know that it had aspirin in it and thus was taking it due to cold and cough at home.  EGD 12/05/2017 Nonbleeding erosive gastropathy, gastric erosions seen in 2017 EGD as well, and the erosions on this EGD appeared similar when compared to previous pictures.  2 cm hiatal hernia noted.  Single gastric polyp noted.  Biopsies negative for H. pylori.  H. pylori serology negative.  Patient had an EGD in June 2017 due to melena, which showed nonbleeding gastric ulcers and patient was placed on PPI.  She had a similar episode in 2016 and Dr. Percell Boston notes from that visit states "Given the studies of the EGD showing gastritis, the capsule showing small bowel without bleeding source and the colonoscopy showing diverticulosis and a colon polyp she most likely bled from the gastritis and would continue PPI treatment and give carafate 50min before meals and follow up with CBC in 1-2 weeks. Woulld check a celiac panel before discharge."  The colonoscopy report is available under provation, and extent of exam was to cecum, with good prep.  Current  Outpatient Medications  Medication Sig Dispense Refill  . cholecalciferol (VITAMIN D3) 25 MCG (1000 UT) tablet Take 1,000 Units by mouth daily.    . ferrous sulfate 325 (65 FE) MG EC tablet Take 1 tablet (325 mg total) by mouth 2 (two) times daily. 60 tablet 0  . guaiFENesin (ROBITUSSIN) 100 MG/5ML SOLN Take 5 mLs by mouth every 4 (four) hours as needed for cough or to loosen phlegm.    . hydrochlorothiazide (HYDRODIURIL) 25 MG tablet Take 1 tablet (25 mg total) by mouth daily. 30 tablet 0  . lidocaine (LIDODERM) 5 % Place 1 patch onto the skin daily. Remove & Discard patch within 12 hours or as directed by MD 7 patch 0  . metoprolol tartrate (LOPRESSOR) 25 MG tablet Take 0.5 tablets (12.5 mg total) by mouth 2 (two) times daily. 60 tablet 0  . pantoprazole (PROTONIX) 40 MG tablet Take 1 tablet (40 mg total) by mouth 2 (two) times daily before a meal. 60 tablet 0  . vitamin B-12 (CYANOCOBALAMIN) 1000 MCG tablet Take 1,000 mcg by mouth daily.    . vitamin C (ASCORBIC ACID) 500 MG tablet Take 500 mg by mouth daily.     No current facility-administered medications for this visit.     Allergies as of 12/20/2017 - Review Complete 12/20/2017  Allergen Reaction Noted  . Demerol [meperidine] Itching 07/02/2015  . Codeine Itching 12/05/2017    ROS:  General: Negative for anorexia, weight loss, fever, chills, fatigue, weakness.  ENT: Negative for hoarseness, difficulty swallowing , nasal congestion. CV: Negative for chest pain, angina, palpitations, dyspnea on exertion, peripheral edema.  Respiratory: Negative for dyspnea at rest, dyspnea on exertion, cough, sputum, wheezing.  GI: See history of present illness. GU:  Negative for dysuria, hematuria, urinary incontinence, urinary frequency, nocturnal urination.  Endo: Negative for unusual weight change.    Physical Examination:   BP (!) 159/86   Pulse (!) 105   Ht 5\' 8"  (1.727 m)   Wt 248 lb 6.4 oz (112.7 kg)   BMI 37.77 kg/m   General:  Well-nourished, well-developed in no acute distress.  Eyes: No icterus. Conjunctivae pink. Mouth: Oropharyngeal mucosa moist and pink , no lesions erythema or exudate. Neck: Supple, Trachea midline Abdomen: Bowel sounds are normal, nontender, nondistended, no hepatosplenomegaly or masses, no abdominal bruits or hernia , no rebound or guarding.   Extremities: No lower extremity edema. No clubbing or deformities. Neuro: Alert and oriented x 3.  Grossly intact. Skin: Warm and dry, no jaundice.   Psych: Alert and cooperative, normal mood and affect.   Labs: CMP     Component Value Date/Time   NA 138 12/05/2017 0555   NA 140 04/23/2014 0337   K 3.8 12/05/2017 0555   K 3.9 04/23/2014 0337   CL 110 12/05/2017 0555   CL 113 (H) 04/23/2014 0337   CO2 22 12/05/2017 0555   CO2 21 (L) 04/23/2014 0337   GLUCOSE 105 (H) 12/05/2017 0555   GLUCOSE 112 (H) 04/23/2014 0337   BUN 25 (H) 12/05/2017 0555   BUN 35 (H) 04/23/2014 0337   CREATININE 0.84 12/05/2017 0555   CREATININE 0.75 04/23/2014 0337   CALCIUM 8.2 (L) 12/05/2017 0555   CALCIUM 7.7 (L) 04/23/2014 0337   PROT 7.1 12/04/2017 0819   PROT 6.4 (L) 04/22/2014 1513   ALBUMIN 3.7 12/04/2017 0819   ALBUMIN 3.5 04/22/2014 1513   AST 24 12/04/2017 0819   AST 19 04/22/2014 1513   ALT 13 12/04/2017 0819   ALT 13 (L) 04/22/2014 1513   ALKPHOS 67 12/04/2017 0819   ALKPHOS 60 04/22/2014 1513   BILITOT 0.8 12/04/2017 0819   BILITOT 0.4 04/22/2014 1513   GFRNONAA >60 12/05/2017 0555   GFRNONAA >60 04/23/2014 0337   GFRAA >60 12/05/2017 0555   GFRAA >60 04/23/2014 0337   Lab Results  Component Value Date   WBC 5.7 12/05/2017   HGB 7.0 (L) 12/06/2017   HCT 24.8 (L) 12/05/2017   MCV 88.2 12/05/2017   PLT 245 12/05/2017    Imaging Studies: No results found.  Assessment and Plan:   Veronica Moon is a 72 y.o. y/o female here for follow-up from November 2019 hospitalization for melena and anemia, in the setting of NSAID use  (Alka-Seltzer cold which has aspirin in it), with EGD showing linear gastric erosions in the gastric body similar to 2017 EGD at which time patient was taking Advil  Patient denies any further melena since hospital discharge Reports compliance with PPI Denies any further NSAID use Her and her family, daughter, were advised to always read the ingredients of any medication they are taking over-the-counter to ensure there are no NSAIDs in them. Avoid NSAID use such as Ibuprofen, Aleeve, advil, motrin, BC and Goodie powder, Naproxen, Meloxicam and others.  In the future, if aspirin use is indicated by the cardiologist she is seeing, or primary care provider, this would be okay as cardiac prevention is also important, and prolonged PPI for course can be  considered at that time if needed.  We will decrease PPI from twice daily to once daily after 30 days of use which would be December 11 Patient advised about this.  Her last colonoscopy was in the setting of GI bleed, and she has never had any other colonoscopies. We therefore discussed screening colonoscopy and she is agreeable to getting this done with her repeat EGD to evaluate if the gastric erosions have completely healed.  I have discussed alternative options, risks & benefits,  which include, but are not limited to, bleeding, infection, perforation,respiratory complication & drug reaction.  The patient agrees with this plan & written consent will be obtained.     Dr Vonda Antigua

## 2017-12-21 ENCOUNTER — Ambulatory Visit: Payer: Medicare HMO | Admitting: Gastroenterology

## 2017-12-23 LAB — IRON AND TIBC
Iron Saturation: 6 % — CL (ref 15–55)
Iron: 24 ug/dL — ABNORMAL LOW (ref 27–139)
TIBC: 416 ug/dL (ref 250–450)
UIBC: 392 ug/dL — AB (ref 118–369)

## 2017-12-23 LAB — CBC
Hematocrit: 29.7 % — ABNORMAL LOW (ref 34.0–46.6)
Hemoglobin: 9.2 g/dL — ABNORMAL LOW (ref 11.1–15.9)
MCH: 26 pg — AB (ref 26.6–33.0)
MCHC: 31 g/dL — AB (ref 31.5–35.7)
MCV: 84 fL (ref 79–97)
PLATELETS: 395 10*3/uL (ref 150–450)
RBC: 3.54 x10E6/uL — AB (ref 3.77–5.28)
RDW: 15.9 % — ABNORMAL HIGH (ref 12.3–15.4)
WBC: 5.6 10*3/uL (ref 3.4–10.8)

## 2017-12-23 LAB — FERRITIN: Ferritin: 17 ng/mL (ref 15–150)

## 2018-01-05 ENCOUNTER — Telehealth: Payer: Self-pay

## 2018-01-05 DIAGNOSIS — D508 Other iron deficiency anemias: Secondary | ICD-10-CM

## 2018-01-05 NOTE — Telephone Encounter (Signed)
-----   Message from Virgel Manifold, MD sent at 01/05/2018  9:15 AM EST ----- Jackelyn Poling please let patient know, her hemoglobin has improved since her hospitalization.  She should continue to avoid over-the-counter products that have NSAIDs in it and read labels of anything she is taking.  Please refer her to hematology for iron deficiency.  Please tell her nurses so they can evaluate if she is a candidate for IV iron as that will allow her iron levels and hemoglobin to better respond and improve

## 2018-01-05 NOTE — Telephone Encounter (Signed)
Pt aware and states she is not taking any medication except Tylenol x1 for her shoulder. Will send referral to hematology and pt to contact office if she has not heard anything in a week from them.

## 2018-01-05 NOTE — Telephone Encounter (Signed)
LMTCO.

## 2018-01-09 DIAGNOSIS — D500 Iron deficiency anemia secondary to blood loss (chronic): Secondary | ICD-10-CM | POA: Insufficient documentation

## 2018-01-09 NOTE — Progress Notes (Signed)
[facility]  [phone] [phone]  ID: Veronica Moon OB: 22-May-1945  MR#: [id number]  POE#:[id number]  Patient Care Team: Sallee Lange, NP as PCP - General (Internal Medicine)  CHIEF COMPLAINT: Iron deficiency anemia  INTERVAL HISTORY: Patient is a 72 year old female with history of recent GI bleed who was referred for further evaluation and consideration of IV Feraheme.  She continues to have persistent weakness and fatigue, but otherwise feels well.  She has no neurologic complaints.  She denies any recent fevers.  She has a fair appetite, but denies weight loss.  She has no chest pain or shortness of breath.  She does admit to dyspnea on exertion.  She has no nausea, vomiting, constipation, or diarrhea.  She has noted no further melena or hematochezia.  She has no urinary complaints.  Patient otherwise feels well and offers no further specific complaints today.  REVIEW OF SYSTEMS:   Review of Systems  Constitutional: Positive for malaise/fatigue. Negative for fever and weight loss.  Respiratory: Negative.  Negative for cough, hemoptysis and shortness of breath.   Cardiovascular: Negative.  Negative for chest pain and leg swelling.  Gastrointestinal: Negative.  Negative for abdominal pain, blood in stool and melena.  Genitourinary: Negative.  Negative for hematuria.  Musculoskeletal: Negative.  Negative for back pain.  Skin: Negative.  Negative for rash.  Neurological: Positive for weakness. Negative for focal weakness and headaches.  Psychiatric/Behavioral: Negative.  The patient is not nervous/anxious.     As per HPI. Otherwise, a complete review of systems is negative.  PAST MEDICAL HISTORY: Past Medical History:  Diagnosis Date  . Blood transfusion without reported diagnosis   . Cancer ([place])    right leg 10/26/17  . Colon polyps   . Gastritis   . GIB (gastrointestinal bleeding)   . Hypertension     PAST SURGICAL  HISTORY: Past Surgical History:  Procedure Laterality Date  . CHOLECYSTECTOMY    . ESOPHAGOGASTRODUODENOSCOPY (EGD) WITH PROPOFOL N/A 07/03/2015   Procedure: ESOPHAGOGASTRODUODENOSCOPY (EGD) WITH PROPOFOL;  Surgeon: Lucilla Lame, MD;  Location: [facility] ENDOSCOPY;  Service: Endoscopy;  Laterality: N/A;    FAMILY HISTORY: Family History  Problem Relation Age of Onset  . Hypertension Father   . Heart attack Father     ADVANCED DIRECTIVES (Y/N):  N  HEALTH MAINTENANCE: Social History   Tobacco Use  . Smoking status: Never Smoker  . Smokeless tobacco: Never Used  Substance Use Topics  . Alcohol use: No  . Drug use: No     Colonoscopy:  PAP:  Bone density:  Lipid panel:  Allergies  Allergen Reactions  . Demerol [Meperidine] Itching  . Codeine Itching    Current Outpatient Medications  Medication Sig Dispense Refill  . cholecalciferol (VITAMIN D3) 25 MCG (1000 UT) tablet Take 1,000 Units by mouth daily.    Marland Kitchen guaiFENesin (ROBITUSSIN) 100 MG/5ML SOLN Take 5 mLs by mouth every 4 (four) hours as needed for cough or to loosen phlegm.    . hydrochlorothiazide (HYDRODIURIL) 25 MG tablet Take 1 tablet (25 mg total) by mouth daily. 30 tablet 0  . metoprolol tartrate (LOPRESSOR) 25 MG tablet Take 0.5 tablets (12.5 mg total) by mouth 2 (two) times daily. 60 tablet 0  . pantoprazole (PROTONIX) 40 MG tablet Take 1 tablet (40 mg total) by mouth 2 (two) times daily before a meal. 60 tablet 0  . pantoprazole (PROTONIX) 40 MG tablet Take 1 tablet (40 mg total) by mouth daily. 30 tablet 1  .  vitamin B-12 (CYANOCOBALAMIN) 1000 MCG tablet Take 1,000 mcg by mouth daily.    . vitamin C (ASCORBIC ACID) 500 MG tablet Take 500 mg by mouth daily.    . ferrous sulfate 325 (65 FE) MG EC tablet Take 1 tablet (325 mg total) by mouth 2 (two) times daily. 60 tablet 0  . lidocaine (LIDODERM) 5 % Place 1 patch onto the skin daily. Remove & Discard patch within 12 hours or as directed by MD (Patient not taking:  Reported on 01/13/2018) 7 patch 0   No current facility-administered medications for this visit.     OBJECTIVE: Vitals:   01/13/18 1504  BP: (!) 183/91  Pulse: 83  Resp: 18  Temp: 98.8 F (37.1 C)     Body mass index is 37.59 kg/m.    ECOG FS:0 - Asymptomatic  General: Well-developed, well-nourished, no acute distress. Eyes: Pink conjunctiva, anicteric sclera. HEENT: Normocephalic, moist mucous membranes, clear oropharnyx. Lungs: Clear to auscultation bilaterally. Heart: Regular rate and rhythm. No rubs, murmurs, or gallops. Abdomen: Soft, nontender, nondistended. No organomegaly noted, normoactive bowel sounds. Musculoskeletal: No edema, cyanosis, or clubbing. Neuro: Alert, answering all questions appropriately. Cranial nerves grossly intact. Skin: No rashes or petechiae noted. Psych: Normal affect. Lymphatics: No cervical, calvicular, axillary or inguinal LAD.   LAB RESULTS:  Lab Results  Component Value Date   NA 138 12/05/2017   K 3.8 12/05/2017   CL 110 12/05/2017   CO2 22 12/05/2017   GLUCOSE 105 (H) 12/05/2017   BUN 25 (H) 12/05/2017   CREATININE 0.84 12/05/2017   CALCIUM 8.2 (L) 12/05/2017   PROT 7.1 12/04/2017   ALBUMIN 3.7 12/04/2017   AST 24 12/04/2017   ALT 13 12/04/2017   ALKPHOS 67 12/04/2017   BILITOT 0.8 12/04/2017   GFRNONAA >60 12/05/2017   GFRAA >60 12/05/2017    Lab Results  Component Value Date   WBC 5.6 12/22/2017   NEUTROABS 4.4 07/17/2015   HGB 9.2 (L) 12/22/2017   HCT 29.7 (L) 12/22/2017   MCV 84 12/22/2017   PLT 395 12/22/2017   Lab Results  Component Value Date   IRON 24 (L) 12/22/2017   TIBC 416 12/22/2017   IRONPCTSAT 6 (LL) 12/22/2017   Lab Results  Component Value Date   FERRITIN 17 12/22/2017     STUDIES: No results found.  ASSESSMENT: Iron deficiency anemia  PLAN:  1. Iron deficiency anemia: Likely secondary to GI bleed.  Patient's hemoglobin and iron stores remain persistently decreased.  Upper endoscopy  completed on December 05, 2017 revealed several gastric erosions that were likely the source of her bleeding.  Patient will benefit from IV Feraheme.  Return to clinic in 1 and 2 weeks for treatment.  Patient will then return to clinic in 3 months with repeat laboratory work and further evaluation.  If her hemoglobin does not improve with iron infusions, can consider a full anemia work-up at that time.  Patient also reports she has a colonoscopy and repeat endoscopy scheduled for February 2020.  I spent a total of 45 minutes face-to-face with the patient of which greater than 50% of the visit was spent in counseling and coordination of care as detailed above.   Patient expressed understanding and was in agreement with this plan. She also understands that She can call clinic at any time with any questions, concerns, or complaints.    Lloyd Huger, MD   01/15/2018 8:44 AM

## 2018-01-13 ENCOUNTER — Other Ambulatory Visit: Payer: Self-pay

## 2018-01-13 ENCOUNTER — Encounter (INDEPENDENT_AMBULATORY_CARE_PROVIDER_SITE_OTHER): Payer: Self-pay

## 2018-01-13 ENCOUNTER — Inpatient Hospital Stay: Payer: Medicare HMO | Attending: Oncology | Admitting: Oncology

## 2018-01-13 DIAGNOSIS — D508 Other iron deficiency anemias: Secondary | ICD-10-CM | POA: Insufficient documentation

## 2018-01-13 DIAGNOSIS — K259 Gastric ulcer, unspecified as acute or chronic, without hemorrhage or perforation: Secondary | ICD-10-CM | POA: Diagnosis present

## 2018-01-13 DIAGNOSIS — D500 Iron deficiency anemia secondary to blood loss (chronic): Secondary | ICD-10-CM

## 2018-01-13 NOTE — Progress Notes (Signed)
Patient here today for initial evaluation regarding anemia.  

## 2018-01-21 ENCOUNTER — Inpatient Hospital Stay: Payer: Medicare HMO

## 2018-01-21 ENCOUNTER — Encounter (INDEPENDENT_AMBULATORY_CARE_PROVIDER_SITE_OTHER): Payer: Self-pay

## 2018-01-21 VITALS — BP 151/85 | HR 67 | Temp 98.3°F | Resp 18

## 2018-01-21 DIAGNOSIS — D500 Iron deficiency anemia secondary to blood loss (chronic): Secondary | ICD-10-CM

## 2018-01-21 DIAGNOSIS — D508 Other iron deficiency anemias: Secondary | ICD-10-CM | POA: Diagnosis not present

## 2018-01-21 MED ORDER — SODIUM CHLORIDE 0.9 % IV SOLN
510.0000 mg | Freq: Once | INTRAVENOUS | Status: AC
  Administered 2018-01-21: 510 mg via INTRAVENOUS
  Filled 2018-01-21: qty 17

## 2018-01-21 MED ORDER — SODIUM CHLORIDE 0.9 % IV SOLN
Freq: Once | INTRAVENOUS | Status: AC
  Administered 2018-01-21: 14:00:00 via INTRAVENOUS
  Filled 2018-01-21: qty 250

## 2018-01-28 ENCOUNTER — Inpatient Hospital Stay: Payer: Medicare HMO | Attending: Oncology

## 2018-01-28 ENCOUNTER — Inpatient Hospital Stay: Payer: Medicare HMO

## 2018-01-28 VITALS — BP 154/82 | HR 68 | Temp 98.8°F | Resp 18

## 2018-01-28 DIAGNOSIS — D508 Other iron deficiency anemias: Secondary | ICD-10-CM | POA: Diagnosis present

## 2018-01-28 DIAGNOSIS — D500 Iron deficiency anemia secondary to blood loss (chronic): Secondary | ICD-10-CM

## 2018-01-28 DIAGNOSIS — K259 Gastric ulcer, unspecified as acute or chronic, without hemorrhage or perforation: Secondary | ICD-10-CM | POA: Diagnosis not present

## 2018-01-28 MED ORDER — SODIUM CHLORIDE 0.9 % IV SOLN
Freq: Once | INTRAVENOUS | Status: AC
  Administered 2018-01-28: 14:00:00 via INTRAVENOUS
  Filled 2018-01-28: qty 250

## 2018-01-28 MED ORDER — SODIUM CHLORIDE 0.9 % IV SOLN
510.0000 mg | Freq: Once | INTRAVENOUS | Status: AC
  Administered 2018-01-28: 510 mg via INTRAVENOUS
  Filled 2018-01-28: qty 17

## 2018-03-09 ENCOUNTER — Other Ambulatory Visit: Payer: Self-pay | Admitting: Gastroenterology

## 2018-03-22 ENCOUNTER — Encounter: Payer: Self-pay | Admitting: *Deleted

## 2018-03-23 ENCOUNTER — Encounter
Admission: RE | Disposition: A | Discharge status: Home / Self Care | Payer: Self-pay | Source: Home / Self Care | Attending: Gastroenterology

## 2018-03-23 ENCOUNTER — Ambulatory Visit: Payer: Medicare HMO | Admitting: Anesthesiology

## 2018-03-23 ENCOUNTER — Ambulatory Visit
Admission: RE | Admit: 2018-03-23 | Discharge: 2018-03-23 | Disposition: A | Discharge status: Home / Self Care | Payer: Medicare HMO | Attending: Gastroenterology | Admitting: Gastroenterology

## 2018-03-23 DIAGNOSIS — K228 Other specified diseases of esophagus: Secondary | ICD-10-CM

## 2018-03-23 DIAGNOSIS — K635 Polyp of colon: Secondary | ICD-10-CM | POA: Diagnosis not present

## 2018-03-23 DIAGNOSIS — Z79899 Other long term (current) drug therapy: Secondary | ICD-10-CM | POA: Diagnosis not present

## 2018-03-23 DIAGNOSIS — K259 Gastric ulcer, unspecified as acute or chronic, without hemorrhage or perforation: Secondary | ICD-10-CM

## 2018-03-23 DIAGNOSIS — K5730 Diverticulosis of large intestine without perforation or abscess without bleeding: Secondary | ICD-10-CM | POA: Diagnosis not present

## 2018-03-23 DIAGNOSIS — K317 Polyp of stomach and duodenum: Secondary | ICD-10-CM | POA: Diagnosis not present

## 2018-03-23 DIAGNOSIS — E785 Hyperlipidemia, unspecified: Secondary | ICD-10-CM | POA: Insufficient documentation

## 2018-03-23 DIAGNOSIS — D125 Benign neoplasm of sigmoid colon: Secondary | ICD-10-CM

## 2018-03-23 DIAGNOSIS — Z8711 Personal history of peptic ulcer disease: Secondary | ICD-10-CM | POA: Diagnosis not present

## 2018-03-23 DIAGNOSIS — K209 Esophagitis, unspecified: Secondary | ICD-10-CM | POA: Insufficient documentation

## 2018-03-23 DIAGNOSIS — K2289 Other specified disease of esophagus: Secondary | ICD-10-CM

## 2018-03-23 DIAGNOSIS — K449 Diaphragmatic hernia without obstruction or gangrene: Secondary | ICD-10-CM | POA: Insufficient documentation

## 2018-03-23 DIAGNOSIS — I10 Essential (primary) hypertension: Secondary | ICD-10-CM | POA: Insufficient documentation

## 2018-03-23 DIAGNOSIS — Z86010 Personal history of colonic polyps: Secondary | ICD-10-CM | POA: Insufficient documentation

## 2018-03-23 DIAGNOSIS — Z09 Encounter for follow-up examination after completed treatment for conditions other than malignant neoplasm: Secondary | ICD-10-CM | POA: Insufficient documentation

## 2018-03-23 DIAGNOSIS — Z1211 Encounter for screening for malignant neoplasm of colon: Secondary | ICD-10-CM | POA: Diagnosis present

## 2018-03-23 HISTORY — PX: ESOPHAGOGASTRODUODENOSCOPY (EGD) WITH PROPOFOL: SHX5813

## 2018-03-23 HISTORY — PX: COLONOSCOPY WITH PROPOFOL: SHX5780

## 2018-03-23 HISTORY — DX: Hyperlipidemia, unspecified: E78.5

## 2018-03-23 SURGERY — COLONOSCOPY WITH PROPOFOL
Anesthesia: General

## 2018-03-23 MED ORDER — PROPOFOL 10 MG/ML IV BOLUS
INTRAVENOUS | Status: DC | PRN
  Administered 2018-03-23: 10 mg via INTRAVENOUS
  Administered 2018-03-23: 20 mg via INTRAVENOUS
  Administered 2018-03-23 (×4): 10 mg via INTRAVENOUS
  Administered 2018-03-23 (×2): 20 mg via INTRAVENOUS
  Administered 2018-03-23: 50 mg via INTRAVENOUS
  Administered 2018-03-23: 10 mg via INTRAVENOUS
  Administered 2018-03-23: 20 mg via INTRAVENOUS
  Administered 2018-03-23 (×6): 10 mg via INTRAVENOUS
  Administered 2018-03-23: 30 mg via INTRAVENOUS
  Administered 2018-03-23 (×3): 10 mg via INTRAVENOUS

## 2018-03-23 MED ORDER — LIDOCAINE HCL (PF) 2 % IJ SOLN
INTRAMUSCULAR | Status: AC
  Filled 2018-03-23: qty 10

## 2018-03-23 MED ORDER — PHENYLEPHRINE HCL 10 MG/ML IJ SOLN
INTRAMUSCULAR | Status: DC | PRN
  Administered 2018-03-23 (×3): 100 ug via INTRAVENOUS

## 2018-03-23 MED ORDER — PROPOFOL 10 MG/ML IV BOLUS
INTRAVENOUS | Status: AC
  Filled 2018-03-23: qty 60

## 2018-03-23 MED ORDER — SODIUM CHLORIDE 0.9 % IV SOLN
INTRAVENOUS | Status: DC
  Administered 2018-03-23: 08:00:00 via INTRAVENOUS

## 2018-03-23 MED ORDER — FAMOTIDINE 20 MG PO TABS: 20.0000 mg | ORAL_TABLET | Freq: Every day | ORAL | 2 refills | Status: DC

## 2018-03-23 MED ORDER — PROPOFOL 10 MG/ML IV BOLUS
INTRAVENOUS | Status: AC
  Filled 2018-03-23: qty 20

## 2018-03-23 MED ORDER — PROPOFOL 500 MG/50ML IV EMUL
INTRAVENOUS | Status: AC
  Filled 2018-03-23: qty 350

## 2018-03-23 MED ORDER — PROPOFOL 500 MG/50ML IV EMUL
INTRAVENOUS | Status: AC
  Filled 2018-03-23: qty 50

## 2018-03-23 MED ORDER — LIDOCAINE HCL (CARDIAC) PF 100 MG/5ML IV SOSY
PREFILLED_SYRINGE | INTRAVENOUS | Status: DC | PRN
  Administered 2018-03-23: 100 mg via INTRATRACHEAL

## 2018-03-23 NOTE — Transfer of Care (Signed)
Immediate Anesthesia Transfer of Care Note  Patient: Veronica Moon  Procedure(s) Performed: COLONOSCOPY WITH PROPOFOL (N/A ) ESOPHAGOGASTRODUODENOSCOPY (EGD) WITH PROPOFOL (N/A )  Patient Location: Endoscopy Unit  Anesthesia Type:General  Level of Consciousness: drowsy and responds to stimulation  Airway & Oxygen Therapy: Patient Spontanous Breathing and Patient connected to face mask oxygen  Post-op Assessment: Report given to RN and Post -op Vital signs reviewed and stable  Post vital signs: Reviewed and stable  Last Vitals:  Vitals Value Taken Time  BP 97/48 03/23/2018  8:56 AM  Temp 36 C 03/23/2018  8:50 AM  Pulse 66 03/23/2018  8:57 AM  Resp 15 03/23/2018  8:57 AM  SpO2 99 % 03/23/2018  8:57 AM  Vitals shown include unvalidated device data.  Last Pain:  Vitals:   03/23/18 0850  TempSrc: Tympanic  PainSc:          Complications: No apparent anesthesia complications

## 2018-03-23 NOTE — Op Note (Signed)
[facility] Patient Name: Veronica Moon Procedure Date: 03/23/2018 7:48 AM MRN: [id number] Account #: [id number] Date of Birth: 1945/10/04 Admit Type: Outpatient Age: 73 Room: [facility] ENDO ROOM 4 Gender: Female Note Status: Finalized Procedure:            Upper GI endoscopy Indications:          Follow-up of acute gastritis with hemorrhage Providers:            Bryanna Yim B. Bonna Gains MD, MD Referring MD:         Juluis Rainier (Referring MD) Medicines:            Monitored Anesthesia Care Complications:        No immediate complications. Procedure:            Pre-Anesthesia Assessment:                       - The risks and benefits of the procedure and the                        sedation options and risks were discussed with the                        patient. All questions were answered and informed                        consent was obtained.                       - Patient identification and proposed procedure were                        verified prior to the procedure.                       - ASA Grade Assessment: II - A patient with mild                        systemic disease.                       After obtaining informed consent, the endoscope was                        passed under direct vision. Throughout the procedure,                        the patient's blood pressure, pulse, and oxygen                        saturations were monitored continuously. The Endoscope                        was introduced through the mouth, and advanced to the                        second part of duodenum. The upper GI endoscopy was                        accomplished with ease. The patient tolerated the  procedure well. Findings:      One tongue of salmon-colored mucosa was present. No other visible       abnormalities were present. The maximum longitudinal extent of these       esophageal mucosal changes was 0.5 cm in length.  Mucosa was biopsied       with a cold forceps for histology in a targeted manner and in 4       quadrants.      The exam of the esophagus was otherwise normal.      A large hiatal hernia was present.      Four 3 to 4 mm sessile polyps with no bleeding and no stigmata of recent       bleeding were found in the gastric body. Biopsies were taken with a cold       forceps for histology.      There is no endoscopic evidence of bleeding, erythema, erythema or       inflammatory changes suggestive of gastritis, inflammation or ulceration       in the entire examined stomach. Biopsies were taken with a cold forceps       for Helicobacter pylori testing.      The examined duodenum was normal. Impression:           - Salmon-colored mucosa suspicious for short-segment                        Barrett's esophagus. Biopsied.                       - Large hiatal hernia.                       - Four gastric polyps. Biopsied.                       - Normal examined duodenum. Recommendation:       - Await pathology results.                       - d/c PPI                       - Avoid NSAIDs except Aspirin if medically indicated by                        PCP                       - Resume previous diet.                       - Continue present medications.                       - Return to primary care physician as previously                        scheduled.                       - The findings and recommendations were discussed with                        the patient.                       -  The findings and recommendations were discussed with                        the patient's family. Procedure Code(s):    --- Professional ---                       [phone], Esophagogastroduodenoscopy, flexible, transoral;                        with biopsy, single or multiple Diagnosis Code(s):    --- Professional ---                       K22.8, Other specified diseases of esophagus                       K44.9,  Diaphragmatic hernia without obstruction or                        gangrene                       K31.7, Polyp of stomach and duodenum                       K29.01, Acute gastritis with bleeding CPT copyright 2018 American Medical Association. All rights reserved. The codes documented in this report are preliminary and upon coder review may  be revised to meet current compliance requirements.  Vonda Antigua, MD Margretta Sidle B. Bonna Gains MD, MD 03/23/2018 8:28:40 AM This report has been signed electronically. Number of Addenda: 0 Note Initiated On: 03/23/2018 7:48 AM Estimated Blood Loss: Estimated blood loss: none.      [facility]

## 2018-03-23 NOTE — H&P (Signed)
Veronica Antigua, MD [address], [place], [place], [place], [address] [address], [place], [place], [place], [address] Phone: [phone]  Fax: [phone]  Primary Care Physician:  Sallee Lange, NP   Pre-Procedure History & Physical: HPI:  Veronica Moon is a 73 y.o. female is here for a colonoscopy and EGD.   Past Medical History:  Diagnosis Date  . Blood transfusion without reported diagnosis   . 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 Colon polyps   . Gastritis   . GIB (gastrointestinal bleeding)   . Hyperlipidemia   . Hypertension     Past Surgical History:  Procedure Laterality Date  . CHOLECYSTECTOMY    . COLONOSCOPY    . ESOPHAGOGASTRODUODENOSCOPY (EGD) WITH PROPOFOL N/A 07/03/2015   Procedure: ESOPHAGOGASTRODUODENOSCOPY (EGD) WITH PROPOFOL;  Surgeon: Lucilla Lame, MD;  Location: [facility] ENDOSCOPY;  Service: Endoscopy;  Laterality: N/A;  . TUBAL LIGATION      Prior to Admission medications   Medication Sig Start Date End Date Taking? Authorizing Provider  atorvastatin (LIPITOR) 20 MG tablet Take 20 mg by mouth daily.   Yes [provider]  cholecalciferol (VITAMIN D3) 25 MCG (1000 UT) tablet Take 1,000 Units by mouth daily.   Yes [provider]  ferrous sulfate 325 (65 FE) MG tablet Take 325 mg by mouth daily with breakfast.   Yes [provider]  hydrochlorothiazide (HYDRODIURIL) 25 MG tablet Take 1 tablet (25 mg total) by mouth daily. 12/06/17  Yes Sudini, Alveta Heimlich, MD  losartan (COZAAR) 50 MG tablet Take 50 mg by mouth daily.   Yes [provider]  pantoprazole (PROTONIX) 40 MG tablet TAKE 1 TABLET BY MOUTH EVERY DAY 03/16/18  Yes Virgel Manifold, MD  vitamin B-12 (CYANOCOBALAMIN) 1000 MCG tablet Take 1,000 mcg by mouth daily.   Yes [provider]  vitamin C (ASCORBIC ACID) 500 MG tablet Take 500 mg by mouth daily.   Yes [provider]  ferrous sulfate 325 (65 FE) MG EC tablet Take 1  tablet (325 mg total) by mouth 2 (two) times daily. 12/06/17 01/05/18  Hillary Bow, MD  guaiFENesin (ROBITUSSIN) 100 MG/5ML SOLN Take 5 mLs by mouth every 4 (four) hours as needed for cough or to loosen phlegm.    [provider]  lidocaine (LIDODERM) 5 % Place 1 patch onto the skin daily. Remove & Discard patch within 12 hours or as directed by MD Patient not taking: Reported on 01/13/2018 07/05/15   Demetrios Loll, MD  metoprolol tartrate (LOPRESSOR) 25 MG tablet Take 0.5 tablets (12.5 mg total) by mouth 2 (two) times daily. Patient not taking: Reported on 03/23/2018 12/06/17   Hillary Bow, MD    Allergies as of 12/20/2017 - Review Complete 12/20/2017  Allergen Reaction Noted  . Demerol [meperidine] Itching 07/02/2015  . Codeine Itching 12/05/2017    Family History  Problem Relation Age of Onset  . Hypertension Father   . Heart attack Father     Social History   Socioeconomic History  . Marital status: Widowed    Spouse name: Not on file  . Number of children: Not on file  . Years of education: Not on file  . Highest education level: Not on file  Occupational History  . Not on file  Social Needs  . Financial resource strain: Not on file  . Food insecurity:    Worry: Not on file    Inability: Not on file  . Transportation needs:  Medical: Not on file    Non-medical: Not on file  Tobacco Use  . Smoking status: Never Smoker  . Smokeless tobacco: Never Used  Substance and Sexual Activity  . Alcohol use: No  . Drug use: No  . Sexual activity: Not on file  Lifestyle  . Physical activity:    Days per week: Not on file    Minutes per session: Not on file  . Stress: Not on file  Relationships  . Social connections:    Talks on phone: Not on file    Gets together: Not on file    Attends religious service: Not on file    Active member of club or organization: Not on file    Attends meetings of clubs or organizations: Not on file    Relationship status: Not on  file  . Intimate partner violence:    Fear of current or ex partner: Not on file    Emotionally abused: Not on file    Physically abused: Not on file    Forced sexual activity: Not on file  Other Topics Concern  . Not on file  Social History Narrative  . Not on file    Review of Systems: See HPI, otherwise negative ROS  Physical Exam: BP (!) 156/95   Pulse 97   Temp (!) 96.8 F (36 C) (Tympanic)   Resp 16   Ht 5\' 8"  (1.727 m)   Wt 112 kg   SpO2 99%   BMI 37.56 kg/m  General:   Alert,  pleasant and cooperative in NAD Head:  Normocephalic and atraumatic. Neck:  Supple; no masses or thyromegaly. Lungs:  Clear throughout to auscultation, normal respiratory effort.    Heart:  +S1, +S2, Regular rate and rhythm, No edema. Abdomen:  Soft, nontender and nondistended. Normal bowel sounds, without guarding, and without rebound.   Neurologic:  Alert and  oriented x4;  grossly normal neurologically.  Impression/Plan: Veronica Moon is here for a colonoscopy to be performed for average risk screening and EGD for history of gastric erosions and GI bleed  Risks, benefits, limitations, and alternatives regarding the procedures have been reviewed with the patient.  Questions have been answered.  All parties agreeable.   Virgel Manifold, MD  03/23/2018, 8:08 AM

## 2018-03-23 NOTE — Op Note (Signed)
[facility] Patient Name: Veronica Moon Procedure Date: 03/23/2018 7:47 AM MRN: [id number] Account #: [id number] Date of Birth: 09/06/1945 Admit Type: Outpatient Age: 73 Room: [facility] ENDO ROOM 4 Gender: Female Note Status: Finalized Procedure:            Colonoscopy Indications:          Screening for colorectal malignant neoplasm Providers:            Zeus Marquis B. Bonna Gains MD, MD Referring MD:         Juluis Rainier (Referring MD) Medicines:            Monitored Anesthesia Care Complications:        No immediate complications. Procedure:            Pre-Anesthesia Assessment:                       - ASA Grade Assessment: II - A patient with mild                        systemic disease.                       - Prior to the procedure, a History and Physical was                        performed, and patient medications, allergies and                        sensitivities were reviewed. The patient's tolerance of                        previous anesthesia was reviewed.                       - The risks and benefits of the procedure and the                        sedation options and risks were discussed with the                        patient. All questions were answered and informed                        consent was obtained.                       - Patient identification and proposed procedure were                        verified prior to the procedure by the physician, the                        nurse, the anesthesiologist, the anesthetist and the                        technician. The procedure was verified in the procedure                        room.  After obtaining informed consent, the colonoscope was                        passed under direct vision. Throughout the procedure,                        the patient's blood pressure, pulse, and oxygen                        saturations were monitored continuously. The                       Colonoscope was introduced through the anus and                        advanced to the the cecum, identified by appendiceal                        orifice and ileocecal valve. The colonoscopy was                        performed with ease. The patient tolerated the                        procedure well. The quality of the bowel preparation                        was good. Findings:      The perianal and digital rectal examinations were normal.      Three sessile polyps were found in the sigmoid colon. The polyps were 3       to 4 mm in size. These polyps were removed with a cold biopsy forceps.       Resection and retrieval were complete. To prevent bleeding after the       polypectomy, one hemostatic clip was successfully placed. There was no       bleeding at the end of the procedure.      Multiple diverticula were found in the sigmoid colon.      The exam was otherwise without abnormality.      The rectum, sigmoid colon, descending colon, transverse colon, ascending       colon and cecum appeared normal.      The retroflexed view of the distal rectum and anal verge was normal and       showed no anal or rectal abnormalities. Impression:           - Three 3 to 4 mm polyps in the sigmoid colon, removed                        with a cold biopsy forceps. Resected and retrieved.                        Clip was placed.                       - Diverticulosis in the sigmoid colon.                       - The examination was otherwise normal.                       -  The rectum, sigmoid colon, descending colon,                        transverse colon, ascending colon and cecum are normal.                       - The distal rectum and anal verge are normal on                        retroflexion view. Recommendation:       - Discharge patient to home (with escort).                       - High fiber diet.                       - Advance diet as tolerated.                        - Continue present medications.                       - Await pathology results.                       - Repeat colonoscopy date to be determined after                        pending pathology results are reviewed.                       - The findings and recommendations were discussed with                        the patient.                       - The findings and recommendations were discussed with                        the patient's family.                       - Return to primary care physician as previously                        scheduled. Procedure Code(s):    --- Professional ---                       [phone], Colonoscopy, flexible; with biopsy, single or                        multiple Diagnosis Code(s):    --- Professional ---                       Z12.11, Encounter for screening for malignant neoplasm                        of colon                       D12.5, Benign neoplasm of sigmoid colon  K57.30, Diverticulosis of large intestine without                        perforation or abscess without bleeding CPT copyright 2018 American Medical Association. All rights reserved. The codes documented in this report are preliminary and upon coder review may  be revised to meet current compliance requirements.  Vonda Antigua, MD Margretta Sidle B. Bonna Gains MD, MD 03/23/2018 8:56:53 AM This report has been signed electronically. Number of Addenda: 0 Note Initiated On: 03/23/2018 7:47 AM Scope Withdrawal Time: 0 hours 15 minutes 19 seconds  Total Procedure Duration: 0 hours 21 minutes 29 seconds  Estimated Blood Loss: Estimated blood loss: none.      [facility]

## 2018-03-23 NOTE — Anesthesia Preprocedure Evaluation (Addendum)
Anesthesia Evaluation  Patient identified by MRN, date of birth, ID band Patient awake    Reviewed: Allergy & Precautions, H&P , NPO status , Patient's Chart, lab work & pertinent test results  Airway Mallampati: III     Mouth opening: Limited Mouth Opening Comment: Somewhat small mouth opening Dental  (+) Upper Dentures, Missing   Pulmonary neg pulmonary ROS,           Cardiovascular hypertension,      Neuro/Psych negative neurological ROS  negative psych ROS   GI/Hepatic Neg liver ROS, PUD,   Endo/Other  negative endocrine ROS  Renal/GU negative Renal ROS  negative genitourinary   Musculoskeletal   Abdominal   Peds  Hematology  (+) Blood dyscrasia, anemia ,   Anesthesia Other Findings Past Medical History: No date: Blood transfusion without reported diagnosis No date: Cancer [facility])     Comment:  right leg 10/26/17 No date: Colon polyps No date: Gastritis No date: GIB (gastrointestinal bleeding) No date: Hypertension  Past Surgical History: No date: CHOLECYSTECTOMY 07/03/2015: ESOPHAGOGASTRODUODENOSCOPY (EGD) WITH PROPOFOL; N/A     Comment:  Procedure: ESOPHAGOGASTRODUODENOSCOPY (EGD) WITH               PROPOFOL;  Surgeon: Lucilla Lame, MD;  Location: [facility]               ENDOSCOPY;  Service: Endoscopy;  Laterality: N/A;     Reproductive/Obstetrics negative OB ROS                            Anesthesia Physical Anesthesia Plan  ASA: II  Anesthesia Plan: General   Post-op Pain Management:    Induction:   PONV Risk Score and Plan: Propofol infusion and TIVA  Airway Management Planned: Natural Airway and Nasal Cannula  Additional Equipment:   Intra-op Plan:   Post-operative Plan:   Informed Consent: I have reviewed the patients History and Physical, chart, labs and discussed the procedure including the risks, benefits and alternatives for the proposed anesthesia with the  patient or authorized representative who has indicated his/her understanding and acceptance.     Dental Advisory Given  Plan Discussed with: Anesthesiologist and CRNA  Anesthesia Plan Comments:         Anesthesia Quick Evaluation

## 2018-03-23 NOTE — Anesthesia Post-op Follow-up Note (Signed)
Anesthesia QCDR form completed.        

## 2018-03-23 NOTE — OR Nursing (Signed)
PT not alert after 30 minutes DR Ola Spurr in to check pt and everthing is ok.

## 2018-03-24 ENCOUNTER — Encounter: Payer: Self-pay | Admitting: Gastroenterology

## 2018-03-24 LAB — SURGICAL PATHOLOGY

## 2018-03-24 NOTE — Anesthesia Postprocedure Evaluation (Signed)
Anesthesia Post Note  Patient: Veronica Moon  Procedure(s) Performed: COLONOSCOPY WITH PROPOFOL (N/A ) ESOPHAGOGASTRODUODENOSCOPY (EGD) WITH PROPOFOL (N/A )  Patient location during evaluation: PACU Anesthesia Type: General Level of consciousness: awake and alert Pain management: pain level controlled Vital Signs Assessment: post-procedure vital signs reviewed and stable Respiratory status: spontaneous breathing, nonlabored ventilation, respiratory function stable and patient connected to nasal cannula oxygen Cardiovascular status: blood pressure returned to baseline and stable Postop Assessment: no apparent nausea or vomiting Anesthetic complications: no     Last Vitals:  Vitals:   03/23/18 0930 03/23/18 0940  BP: (!) 149/75 (!) 157/66  Pulse: 67 63  Resp: 15 13  Temp:    SpO2: 96% 95%    Last Pain:  Vitals:   03/23/18 0850  TempSrc: Tympanic  PainSc:                  Durenda Hurt

## 2018-03-25 ENCOUNTER — Encounter: Payer: Self-pay | Admitting: Gastroenterology

## 2018-04-19 ENCOUNTER — Inpatient Hospital Stay: Payer: Medicare HMO | Admitting: Oncology

## 2018-04-19 ENCOUNTER — Inpatient Hospital Stay: Payer: Medicare HMO

## 2018-05-03 ENCOUNTER — Inpatient Hospital Stay: Payer: Medicare HMO

## 2018-05-03 ENCOUNTER — Inpatient Hospital Stay: Payer: Medicare HMO | Admitting: Oncology

## 2018-06-08 ENCOUNTER — Other Ambulatory Visit: Payer: Medicare HMO

## 2018-06-08 ENCOUNTER — Ambulatory Visit: Payer: Medicare HMO | Admitting: Oncology

## 2018-06-08 ENCOUNTER — Ambulatory Visit: Payer: Medicare HMO

## 2018-06-11 ENCOUNTER — Other Ambulatory Visit: Payer: Self-pay | Admitting: Gastroenterology

## 2018-09-03 NOTE — Progress Notes (Signed)
[place]  [phone] [phone]  ID: Veronica Moon OB: [birth year]  MR#: [id number]  RCB#:[id number]  Patient Care Team: Sallee Lange, NP as PCP - General (Internal Medicine)  CHIEF COMPLAINT: Iron deficiency anemia  INTERVAL HISTORY: Patient returns to clinic today for repeat laboratory work, further evaluation, and consideration of additional IV Feraheme.  She has chronic weakness and fatigue, but otherwise feels well. She has no neurologic complaints.  She denies any recent fevers or illnesses.  She has a fair appetite, but denies weight loss.  She denies any chest pain, shortness of breath, cough, or hemoptysis. She has no nausea, vomiting, constipation, or diarrhea.  She has noted no further melena or hematochezia.  She has no urinary complaints.  Patient offers no further specific complaints today.  REVIEW OF SYSTEMS:   Review of Systems  Constitutional: Positive for malaise/fatigue. Negative for fever and weight loss.  Respiratory: Negative.  Negative for cough, hemoptysis and shortness of breath.   Cardiovascular: Negative.  Negative for chest pain and leg swelling.  Gastrointestinal: Negative.  Negative for abdominal pain, blood in stool and melena.  Genitourinary: Negative.  Negative for hematuria.  Musculoskeletal: Negative.  Negative for back pain.  Skin: Negative.  Negative for rash.  Neurological: Positive for weakness. Negative for focal weakness and headaches.  Psychiatric/Behavioral: Negative.  The patient is not nervous/anxious.     As per HPI. Otherwise, a complete review of systems is negative.  PAST MEDICAL HISTORY: Past Medical History:  Diagnosis Date  . Blood transfusion without reported diagnosis   . Cancer ([place])    right leg 10/26/17  . Colon polyps   . Gastritis   . GIB (gastrointestinal bleeding)   . Hyperlipidemia   . Hypertension     PAST SURGICAL HISTORY: Past Surgical History:   Procedure Laterality Date  . CHOLECYSTECTOMY    . COLONOSCOPY    . COLONOSCOPY WITH PROPOFOL N/A 03/23/2018   Procedure: COLONOSCOPY WITH PROPOFOL;  Surgeon: Virgel Manifold, MD;  Location: [facility] ENDOSCOPY;  Service: Endoscopy;  Laterality: N/A;  . ESOPHAGOGASTRODUODENOSCOPY (EGD) WITH PROPOFOL N/A 07/03/2015   Procedure: ESOPHAGOGASTRODUODENOSCOPY (EGD) WITH PROPOFOL;  Surgeon: Lucilla Lame, MD;  Location: [facility] ENDOSCOPY;  Service: Endoscopy;  Laterality: N/A;  . ESOPHAGOGASTRODUODENOSCOPY (EGD) WITH PROPOFOL N/A 03/23/2018   Procedure: ESOPHAGOGASTRODUODENOSCOPY (EGD) WITH PROPOFOL;  Surgeon: Virgel Manifold, MD;  Location: [facility] ENDOSCOPY;  Service: Endoscopy;  Laterality: N/A;  . TUBAL LIGATION      FAMILY HISTORY: Family History  Problem Relation Age of Onset  . Hypertension Father   . Heart attack Father     ADVANCED DIRECTIVES (Y/N):  N  HEALTH MAINTENANCE: Social History   Tobacco Use  . Smoking status: Never Smoker  . Smokeless tobacco: Never Used  Substance Use Topics  . Alcohol use: No  . Drug use: No     Colonoscopy:  PAP:  Bone density:  Lipid panel:  Allergies  Allergen Reactions  . Demerol [Meperidine] Itching  . Codeine Itching    Current Outpatient Medications  Medication Sig Dispense Refill  . atorvastatin (LIPITOR) 20 MG tablet Take 20 mg by mouth daily.    . cholecalciferol (VITAMIN D3) 25 MCG (1000 UT) tablet Take 1,000 Units by mouth daily.    . ferrous sulfate 325 (65 FE) MG tablet Take 325 mg by mouth daily with breakfast.    . hydrochlorothiazide (HYDRODIURIL) 25 MG tablet Take 1 tablet (25 mg total) by mouth daily. 30 tablet 0  .  losartan (COZAAR) 50 MG tablet Take 50 mg by mouth daily.    . vitamin B-12 (CYANOCOBALAMIN) 1000 MCG tablet Take 1,000 mcg by mouth daily.    . vitamin C (ASCORBIC ACID) 500 MG tablet Take 500 mg by mouth daily.    . ferrous sulfate 325 (65 FE) MG EC tablet Take 1 tablet (325 mg total) by mouth 2 (two) times  daily. 60 tablet 0   No current facility-administered medications for this visit.     OBJECTIVE: Vitals:   09/05/18 1323 09/05/18 1325  BP:  (!) 156/83  Pulse:  70  Resp: 18      Body mass index is 34.67 kg/m.    ECOG FS:0 - Asymptomatic  General: Well-developed, well-nourished, no acute distress. Eyes: Pink conjunctiva, anicteric sclera. HEENT: Normocephalic, moist mucous membranes. Lungs: Clear to auscultation bilaterally. Heart: Regular rate and rhythm. No rubs, murmurs, or gallops. Abdomen: Soft, nontender, nondistended. No organomegaly noted, normoactive bowel sounds. Musculoskeletal: No edema, cyanosis, or clubbing. Neuro: Alert, answering all questions appropriately. Cranial nerves grossly intact. Skin: No rashes or petechiae noted. Psych: Normal affect.  LAB RESULTS:  Lab Results  Component Value Date   NA 138 12/05/2017   K 3.8 12/05/2017   CL 110 12/05/2017   CO2 22 12/05/2017   GLUCOSE 105 (H) 12/05/2017   BUN 25 (H) 12/05/2017   CREATININE 0.84 12/05/2017   CALCIUM 8.2 (L) 12/05/2017   PROT 7.1 12/04/2017   ALBUMIN 3.7 12/04/2017   AST 24 12/04/2017   ALT 13 12/04/2017   ALKPHOS 67 12/04/2017   BILITOT 0.8 12/04/2017   GFRNONAA >60 12/05/2017   GFRAA >60 12/05/2017    Lab Results  Component Value Date   WBC 5.4 09/05/2018   NEUTROABS 3.5 09/05/2018   HGB 12.2 09/05/2018   HCT 37.6 09/05/2018   MCV 93.8 09/05/2018   PLT 238 09/05/2018   Lab Results  Component Value Date   IRON 60 09/05/2018   TIBC 302 09/05/2018   IRONPCTSAT 20 09/05/2018   Lab Results  Component Value Date   FERRITIN 27 09/05/2018     STUDIES: No results found.  ASSESSMENT: Iron deficiency anemia  PLAN:  1. Iron deficiency anemia: Resolved.  Likely secondary to GI bleed.  Patient's hemoglobin and iron stores are now within normal limits.  Colonoscopy and repeat endoscopy on March 23, 2018 removed several polyps, but otherwise no significant pathology.  She does  not require additional IV Feraheme today.  She last received treatment on January 28, 2018.  Return to clinic in 6 months with repeat laboratory work and further evaluation.  If her hemoglobin and iron stores continue to be within normal limits at that time, she likely can be discharged from clinic.  I spent a total of 20 minutes face-to-face with the patient of which greater than 50% of the visit was spent in counseling and coordination of care as detailed above.  Patient expressed understanding and was in agreement with this plan. She also understands that She can call clinic at any time with any questions, concerns, or complaints.    Lloyd Huger, MD   09/06/2018 7:11 AM

## 2018-09-05 ENCOUNTER — Other Ambulatory Visit: Payer: Self-pay

## 2018-09-05 ENCOUNTER — Inpatient Hospital Stay: Payer: Medicare HMO | Attending: Oncology

## 2018-09-05 ENCOUNTER — Inpatient Hospital Stay: Payer: Medicare HMO

## 2018-09-05 ENCOUNTER — Encounter: Payer: Self-pay | Admitting: Oncology

## 2018-09-05 ENCOUNTER — Inpatient Hospital Stay (HOSPITAL_BASED_OUTPATIENT_CLINIC_OR_DEPARTMENT_OTHER): Payer: Medicare HMO | Admitting: Oncology

## 2018-09-05 VITALS — BP 156/83 | HR 70 | Resp 18 | Wt 228.0 lb

## 2018-09-05 DIAGNOSIS — D509 Iron deficiency anemia, unspecified: Secondary | ICD-10-CM | POA: Diagnosis not present

## 2018-09-05 DIAGNOSIS — D500 Iron deficiency anemia secondary to blood loss (chronic): Secondary | ICD-10-CM

## 2018-09-05 LAB — IRON AND TIBC
Iron: 60 ug/dL (ref 28–170)
Saturation Ratios: 20 % (ref 10.4–31.8)
TIBC: 302 ug/dL (ref 250–450)
UIBC: 242 ug/dL

## 2018-09-05 LAB — CBC WITH DIFFERENTIAL/PLATELET
Abs Immature Granulocytes: 0.01 10*3/uL (ref 0.00–0.07)
Basophils Absolute: 0.1 10*3/uL (ref 0.0–0.1)
Basophils Relative: 1 %
Eosinophils Absolute: 0.1 10*3/uL (ref 0.0–0.5)
Eosinophils Relative: 2 %
HCT: 37.6 % (ref 36.0–46.0)
Hemoglobin: 12.2 g/dL (ref 12.0–15.0)
Immature Granulocytes: 0 %
Lymphocytes Relative: 25 %
Lymphs Abs: 1.4 10*3/uL (ref 0.7–4.0)
MCH: 30.4 pg (ref 26.0–34.0)
MCHC: 32.4 g/dL (ref 30.0–36.0)
MCV: 93.8 fL (ref 80.0–100.0)
Monocytes Absolute: 0.4 10*3/uL (ref 0.1–1.0)
Monocytes Relative: 7 %
Neutro Abs: 3.5 10*3/uL (ref 1.7–7.7)
Neutrophils Relative %: 65 %
Platelets: 238 10*3/uL (ref 150–400)
RBC: 4.01 MIL/uL (ref 3.87–5.11)
RDW: 12 % (ref 11.5–15.5)
WBC: 5.4 10*3/uL (ref 4.0–10.5)
nRBC: 0 % (ref 0.0–0.2)

## 2018-09-05 LAB — FERRITIN: Ferritin: 27 ng/mL (ref 11–307)

## 2018-09-05 NOTE — Progress Notes (Signed)
Patient reports pain in legs, following up with pcp tomorrow.

## 2018-09-06 ENCOUNTER — Other Ambulatory Visit: Payer: Self-pay | Admitting: Gastroenterology

## 2018-09-16 ENCOUNTER — Other Ambulatory Visit: Payer: Self-pay | Admitting: Orthopedic Surgery

## 2018-09-16 DIAGNOSIS — G8929 Other chronic pain: Secondary | ICD-10-CM

## 2018-09-16 DIAGNOSIS — M2391 Unspecified internal derangement of right knee: Secondary | ICD-10-CM

## 2018-09-16 DIAGNOSIS — M25361 Other instability, right knee: Secondary | ICD-10-CM

## 2018-09-20 NOTE — Telephone Encounter (Signed)
Pt needs appt in clinic for another further refills

## 2018-09-28 ENCOUNTER — Other Ambulatory Visit: Payer: Self-pay

## 2018-09-28 ENCOUNTER — Ambulatory Visit
Admission: RE | Admit: 2018-09-28 | Discharge: 2018-09-28 | Disposition: A | Discharge status: Home / Self Care | Payer: Medicare HMO | Source: Ambulatory Visit | Attending: Orthopedic Surgery | Admitting: Orthopedic Surgery

## 2018-09-28 DIAGNOSIS — G8929 Other chronic pain: Secondary | ICD-10-CM | POA: Insufficient documentation

## 2018-09-28 DIAGNOSIS — M25561 Pain in right knee: Secondary | ICD-10-CM | POA: Diagnosis present

## 2018-09-28 DIAGNOSIS — M25361 Other instability, right knee: Secondary | ICD-10-CM | POA: Diagnosis not present

## 2018-09-28 DIAGNOSIS — M2391 Unspecified internal derangement of right knee: Secondary | ICD-10-CM | POA: Insufficient documentation

## 2018-10-09 ENCOUNTER — Other Ambulatory Visit: Payer: Self-pay | Admitting: Gastroenterology

## 2018-10-10 NOTE — Telephone Encounter (Signed)
Last refill 09/20/2018 0 refills

## 2019-03-03 NOTE — Progress Notes (Signed)
[place]  [phone] [phone]) [phone]  ID: Veronica Moon OB: February 24, 1945  MR#: [id number]  [id number]  Patient Care Team: Sallee Lange, NP as PCP - General (Internal Medicine)  CHIEF COMPLAINT: Iron deficiency anemia  INTERVAL HISTORY: Patient returns to clinic today for repeat laboratory work, further evaluation, and consideration of additional IV iron.  She currently feels well and is asymptomatic.  She does not complain of weakness or fatigue today.  She has no neurologic complaints.  She denies any recent fevers or illnesses.  She has a fair appetite, but denies weight loss.  She denies any chest pain, shortness of breath, cough, or hemoptysis. She has no nausea, vomiting, constipation, or diarrhea.  She has noted no further melena or hematochezia.  She has no urinary complaints.  Patient offers no further specific complaints today.  REVIEW OF SYSTEMS:   Review of Systems  Constitutional: Positive for malaise/fatigue. Negative for fever and weight loss.  Respiratory: Negative.  Negative for cough, hemoptysis and shortness of breath.   Cardiovascular: Negative.  Negative for chest pain and leg swelling.  Gastrointestinal: Negative.  Negative for abdominal pain, blood in stool and melena.  Genitourinary: Negative.  Negative for hematuria.  Musculoskeletal: Negative.  Negative for back pain.  Skin: Negative.  Negative for rash.  Neurological: Positive for weakness. Negative for focal weakness and headaches.  Psychiatric/Behavioral: Negative.  The patient is not nervous/anxious.     As per HPI. Otherwise, a complete review of systems is negative.  PAST MEDICAL HISTORY: Past Medical History:  Diagnosis Date  . Blood transfusion without reported diagnosis   . Cancer ([place])    right leg 10/26/17  . Colon polyps   . Gastritis   . GIB (gastrointestinal bleeding)   . Hyperlipidemia   . Hypertension     PAST SURGICAL  HISTORY: Past Surgical History:  Procedure Laterality Date  . CHOLECYSTECTOMY    . COLONOSCOPY    . COLONOSCOPY WITH PROPOFOL N/A 03/23/2018   Procedure: COLONOSCOPY WITH PROPOFOL;  Surgeon: Virgel Manifold, MD;  Location: [facility] END[facility];  Service: Endoscopy;  Laterality: N/A;  . ESOPHAGOGASTRODUODENOSCOPY (EGD) WITH PROPOFOL N/A 07/03/2015   Procedure: ESOPHAGOGASTRODUODENOSCOPY (EGD) WITH PROPOFOL;  Surgeon: Lucilla Lame, MD;  Location: [facility];  Service: Endoscopy;  Laterality: N/A;  . ESOPHAGOGASTRODUODENOSCOPY (EGD) WITH PROPOFOL N/A 03/23/2018   Procedure: ESOPHAGOGASTRODUODENOSCOPY (EGD) WITH PROPOFOL;  Surgeon: Virgel Manifold, MD;  Location: [facility];  Service: Endoscopy;  Laterality: N/A;  . TUBAL LIGATION      FAMILY HISTORY: Family History  Problem Relation Age of Onset  . Hypertension Father   . Heart attack Father     ADVANCED DIRECTIVES (Y/N):  N  HEALTH MAINTENANCE: Social History   Tobacco Use  . Smoking status: Never Smoker  . Smokeless tobacco: Never Used  Substance Use Topics  . Alcohol use: No  . Drug use: No     Colonoscopy:  PAP:  Bone density:  Lipid panel:  Allergies  Allergen Reactions  . Demerol [Meperidine] Itching  . Codeine Itching    Current Outpatient Medications  Medication Sig Dispense Refill  . atorvastatin (LIPITOR) 20 MG tablet Take 20 mg by mouth daily.    . cholecalciferol (VITAMIN D3) 25 MCG (1000 UT) tablet Take 1,000 Units by mouth daily.    . ferrous sulfate 325 (65 FE) MG EC tablet Take 1 tablet (325 mg total) by mouth 2 (two) times daily. 60 tablet 0  . hydrochlorothiazide (HYDRODIURIL) 25 MG  tablet Take 1 tablet (25 mg total) by mouth daily. 30 tablet 0  . losartan (COZAAR) 50 MG tablet Take 50 mg by mouth daily. Take 2 tablets a day    . traMADol (ULTRAM) 50 MG tablet Take by mouth.    . vitamin B-12 (CYANOCOBALAMIN) 1000 MCG tablet Take 1,000 mcg by mouth daily.     No current  facility-administered medications for this visit.    OBJECTIVE: Vitals:   03/07/19 1311  BP: (!) 143/70  Pulse: 73  Resp: 18  Temp: 98.9 F (37.2 C)  SpO2: 100%     Body mass index is 34.21 kg/m.    ECOG FS:0 - Asymptomatic  General: Well-developed, well-nourished, no acute distress. Eyes: Pink conjunctiva, anicteric sclera. HEENT: Normocephalic, moist mucous membranes. Lungs: No audible wheezing or coughing. Heart: Regular rate and rhythm. Abdomen: Soft, nontender, no obvious distention. Musculoskeletal: No edema, cyanosis, or clubbing. Neuro: Alert, answering all questions appropriately. Cranial nerves grossly intact. Skin: No rashes or petechiae noted. Psych: Normal affect.   LAB RESULTS:  Lab Results  Component Value Date   NA 138 12/05/2017   K 3.8 12/05/2017   CL 110 12/05/2017   CO2 22 12/05/2017   GLUCOSE 105 (H) 12/05/2017   BUN 25 (H) 12/05/2017   CREATININE 0.84 12/05/2017   CALCIUM 8.2 (L) 12/05/2017   PROT 7.1 12/04/2017   ALBUMIN 3.7 12/04/2017   AST 24 12/04/2017   ALT 13 12/04/2017   ALKPHOS 67 12/04/2017   BILITOT 0.8 12/04/2017   GFRNONAA >60 12/05/2017   GFRAA >60 12/05/2017    Lab Results  Component Value Date   WBC 6.1 03/07/2019   NEUTROABS 4.0 03/07/2019   HGB 12.2 03/07/2019   HCT 39.0 03/07/2019   MCV 95.1 03/07/2019   PLT 294 03/07/2019   Lab Results  Component Value Date   IRON 86 03/07/2019   TIBC 346 03/07/2019   IRONPCTSAT 25 03/07/2019   Lab Results  Component Value Date   FERRITIN 19 03/07/2019     STUDIES: No results found.  ASSESSMENT: Iron deficiency anemia  PLAN:  1. Iron deficiency anemia: Resolved.  Likely secondary to GI bleed.  Patient's hemoglobin and iron stores continue to be within normal limits. Colonoscopy and repeat endoscopy on March 23, 2018 removed several polyps, but otherwise no significant pathology.  She does not require additional IV Feraheme today.  She last received treatment on  January 28, 2018.  After lengthy discussion with the patient, it was determined that no further follow-up is necessary.  Please refer patient back if there are any questions or concerns.  I spent a total of 20 minutes reviewing chart data, face-to-face evaluation with the patient, counseling and coordination of care as detailed above.  Patient expressed understanding and was in agreement with this plan. She also understands that She can call clinic at any time with any questions, concerns, or complaints.    Lloyd Huger, MD   03/09/2019 6:37 AM

## 2019-03-06 ENCOUNTER — Other Ambulatory Visit: Payer: Self-pay | Admitting: Emergency Medicine

## 2019-03-06 DIAGNOSIS — D500 Iron deficiency anemia secondary to blood loss (chronic): Secondary | ICD-10-CM

## 2019-03-07 ENCOUNTER — Inpatient Hospital Stay: Payer: Medicare HMO | Attending: Oncology

## 2019-03-07 ENCOUNTER — Encounter: Payer: Self-pay | Admitting: Oncology

## 2019-03-07 ENCOUNTER — Other Ambulatory Visit: Payer: Self-pay

## 2019-03-07 ENCOUNTER — Inpatient Hospital Stay: Payer: Medicare HMO

## 2019-03-07 ENCOUNTER — Inpatient Hospital Stay: Payer: Medicare HMO | Admitting: Oncology

## 2019-03-07 VITALS — BP 143/70 | HR 73 | Temp 98.9°F | Resp 18 | Wt 225.0 lb

## 2019-03-07 DIAGNOSIS — D500 Iron deficiency anemia secondary to blood loss (chronic): Secondary | ICD-10-CM

## 2019-03-07 DIAGNOSIS — D509 Iron deficiency anemia, unspecified: Secondary | ICD-10-CM | POA: Diagnosis present

## 2019-03-07 LAB — IRON AND TIBC
Iron: 86 ug/dL (ref 28–170)
Saturation Ratios: 25 % (ref 10.4–31.8)
TIBC: 346 ug/dL (ref 250–450)
UIBC: 260 ug/dL

## 2019-03-07 LAB — CBC WITH DIFFERENTIAL/PLATELET
Abs Immature Granulocytes: 0.02 10*3/uL (ref 0.00–0.07)
Basophils Absolute: 0.1 10*3/uL (ref 0.0–0.1)
Basophils Relative: 1 %
Eosinophils Absolute: 0.1 10*3/uL (ref 0.0–0.5)
Eosinophils Relative: 2 %
HCT: 39 % (ref 36.0–46.0)
Hemoglobin: 12.2 g/dL (ref 12.0–15.0)
Immature Granulocytes: 0 %
Lymphocytes Relative: 24 %
Lymphs Abs: 1.4 10*3/uL (ref 0.7–4.0)
MCH: 29.8 pg (ref 26.0–34.0)
MCHC: 31.3 g/dL (ref 30.0–36.0)
MCV: 95.1 fL (ref 80.0–100.0)
Monocytes Absolute: 0.5 10*3/uL (ref 0.1–1.0)
Monocytes Relative: 7 %
Neutro Abs: 4 10*3/uL (ref 1.7–7.7)
Neutrophils Relative %: 66 %
Platelets: 294 10*3/uL (ref 150–400)
RBC: 4.1 MIL/uL (ref 3.87–5.11)
RDW: 12.5 % (ref 11.5–15.5)
WBC: 6.1 10*3/uL (ref 4.0–10.5)
nRBC: 0 % (ref 0.0–0.2)

## 2019-03-07 LAB — FERRITIN: Ferritin: 19 ng/mL (ref 11–307)

## 2019-03-07 NOTE — Progress Notes (Signed)
Pt in for follow up, states fatigued at times.  Denies any concerns today.

## 2019-10-12 ENCOUNTER — Other Ambulatory Visit: Payer: Self-pay | Admitting: Nurse Practitioner

## 2019-10-12 DIAGNOSIS — Z780 Asymptomatic menopausal state: Secondary | ICD-10-CM

## 2020-06-06 ENCOUNTER — Other Ambulatory Visit: Payer: Self-pay | Admitting: Nephrology

## 2020-06-06 DIAGNOSIS — N1832 Chronic kidney disease, stage 3b: Secondary | ICD-10-CM

## 2020-06-28 ENCOUNTER — Other Ambulatory Visit: Payer: Self-pay

## 2020-06-28 ENCOUNTER — Ambulatory Visit
Admission: RE | Admit: 2020-06-28 | Discharge: 2020-06-28 | Disposition: A | Discharge status: Home / Self Care | Payer: Medicare HMO | Source: Ambulatory Visit | Attending: Nephrology | Admitting: Nephrology

## 2020-06-28 DIAGNOSIS — N1832 Chronic kidney disease, stage 3b: Secondary | ICD-10-CM | POA: Diagnosis present

## 2020-11-07 ENCOUNTER — Other Ambulatory Visit: Payer: Self-pay | Admitting: Nephrology

## 2020-11-07 DIAGNOSIS — N1832 Chronic kidney disease, stage 3b: Secondary | ICD-10-CM

## 2020-11-19 ENCOUNTER — Other Ambulatory Visit: Payer: Self-pay

## 2020-11-19 ENCOUNTER — Ambulatory Visit
Admission: RE | Admit: 2020-11-19 | Discharge: 2020-11-19 | Disposition: A | Discharge status: Home / Self Care | Payer: Medicare HMO | Source: Ambulatory Visit | Attending: Nephrology | Admitting: Nephrology

## 2020-11-19 DIAGNOSIS — N1832 Chronic kidney disease, stage 3b: Secondary | ICD-10-CM | POA: Insufficient documentation

## 2021-07-09 DIAGNOSIS — N1832 Chronic kidney disease, stage 3b: Secondary | ICD-10-CM | POA: Diagnosis not present

## 2021-07-09 DIAGNOSIS — I10 Essential (primary) hypertension: Secondary | ICD-10-CM | POA: Diagnosis not present

## 2021-07-09 DIAGNOSIS — D631 Anemia in chronic kidney disease: Secondary | ICD-10-CM | POA: Diagnosis not present

## 2021-08-05 IMAGING — MR MR KNEE*R* W/O CM
7 series · 40 of 40 positions shown · non-contrast
Comparison: None.

CLINICAL DATA: Twisting injury 3 months ago

EXAM:
MRI OF THE RIGHT KNEE WITHOUT CONTRAST
TECHNIQUE: Multiplanar, multisequence MR imaging of the knee was performed. No
intravenous contrast was administered.

[Series 8: T2 fat-sat · axial · right · 4.0mm · 0.50mm/px · z∈[-44,+80]mm · 6 of 26 slices shown (1 of 3)]
[im 1/26]
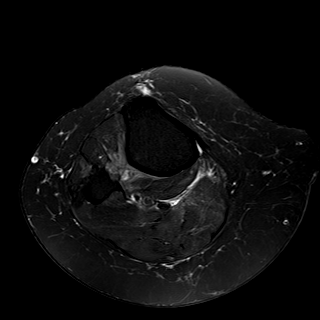
[im 6/26]
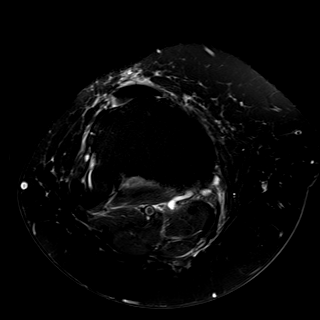
[im 11/26]
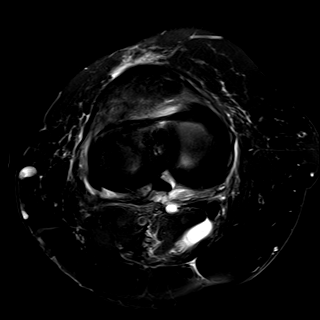
[im 16/26]
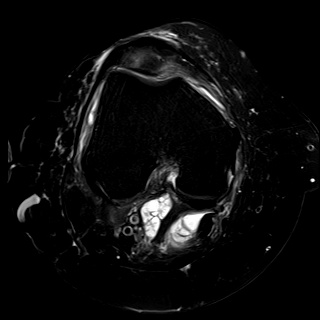
[im 21/26]
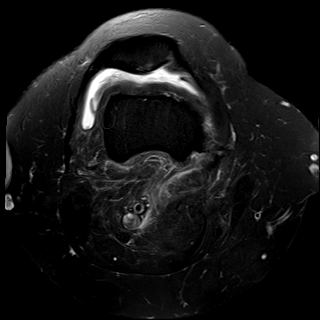
[im 26/26]
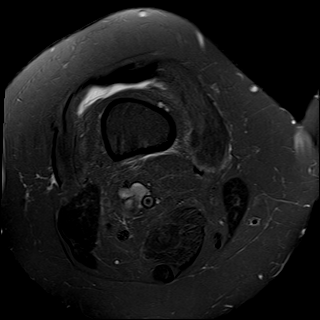

[Series 9: T1 · coronal · right · 4.0mm · 0.42mm/px · 6 of 32 slices shown]
[im 1/32]
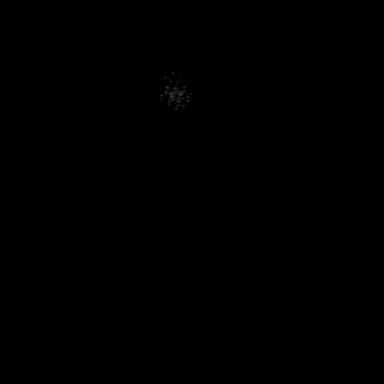
[im 7/32]
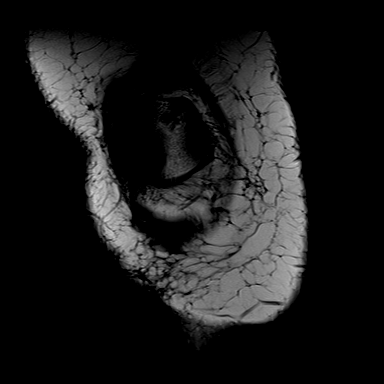
[im 13/32]
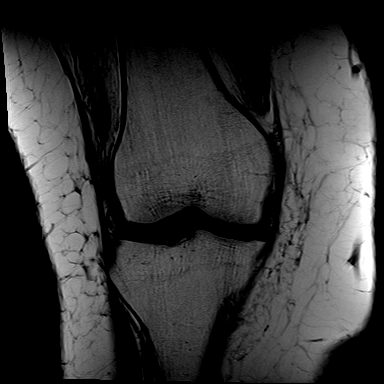
[im 19/32]
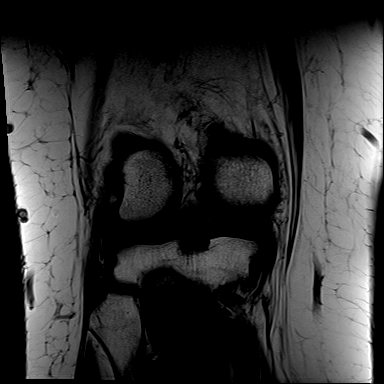
[im 25/32]
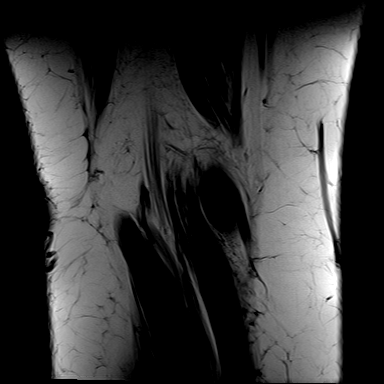
[im 32/32]
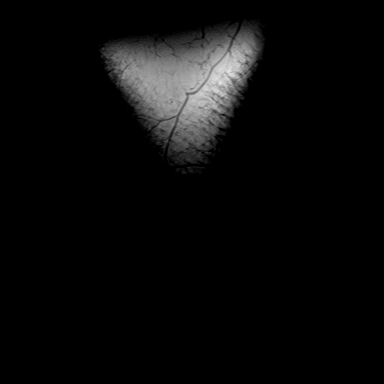

[Series 10: PD fat-sat · sagittal · right · 3.0mm · 0.59mm/px · 7 of 35 slices shown (1 of 2)]
[im 1/35]
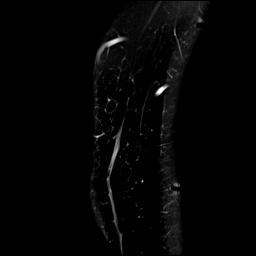
[im 6/35]
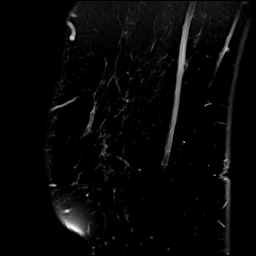
[im 12/35]
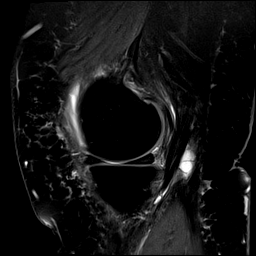
[im 18/35]
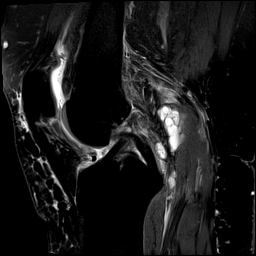
[im 23/35]
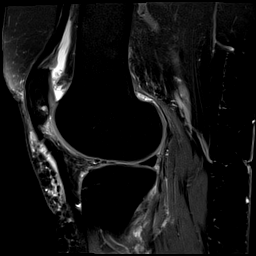
[im 29/35]
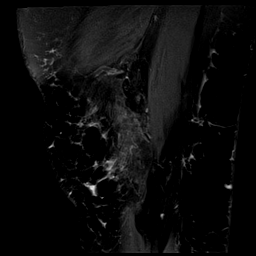
[im 35/35]
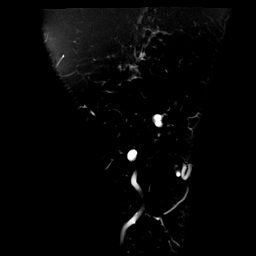

[Series 11: T2 fat-sat · coronal · right · 4.0mm · 0.59mm/px · 6 of 30 slices shown (2 of 3)]
[im 1/30]
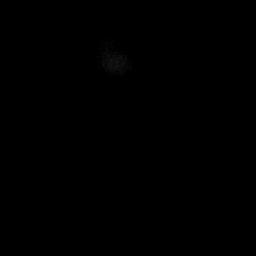
[im 6/30]
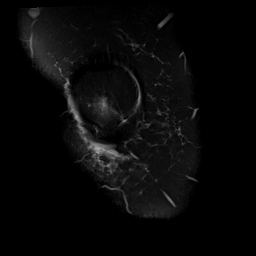
[im 12/30]
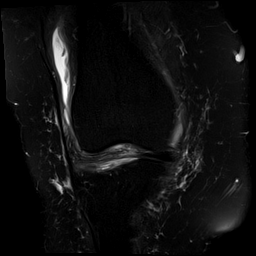
[im 18/30]
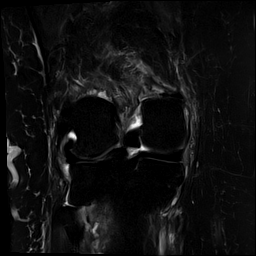
[im 24/30]
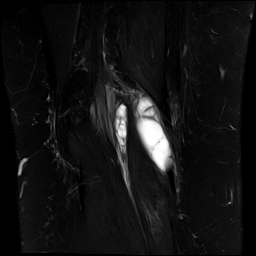
[im 30/30]
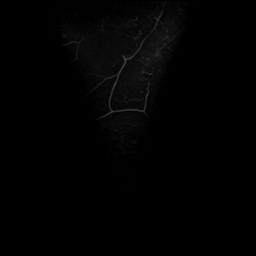

[Series 12: PD fat-sat · coronal · right · 4.0mm · 0.59mm/px · 6 of 32 slices shown (2 of 2)]
[im 1/32]
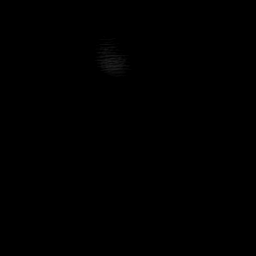
[im 7/32]
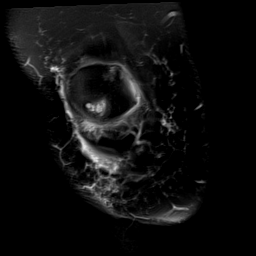
[im 13/32]
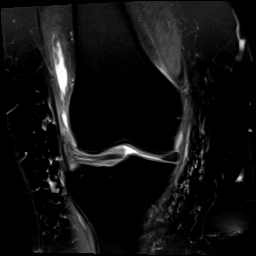
[im 19/32]
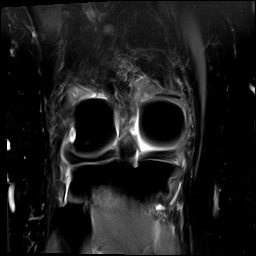
[im 25/32]
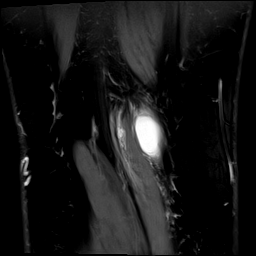
[im 32/32]
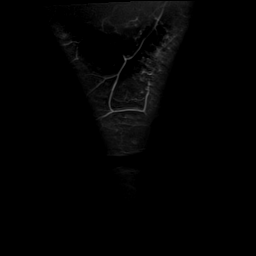

[Series 13: T2 fat-sat · sagittal · right · 3.0mm · 0.59mm/px · 7 of 36 slices shown (3 of 3)]
[im 1/36]
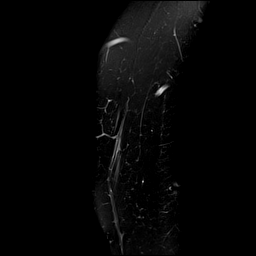
[im 6/36]
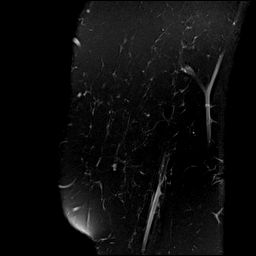
[im 12/36]
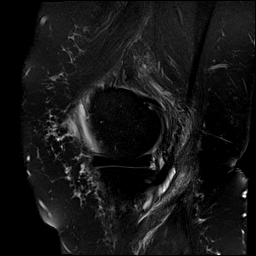
[im 18/36]
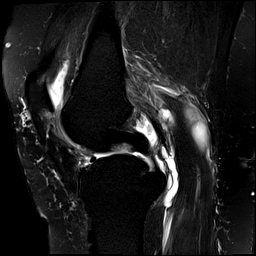
[im 24/36]
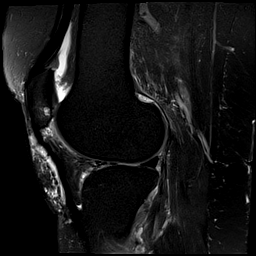
[im 30/36]
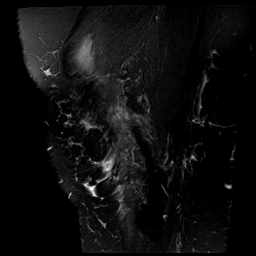
[im 36/36]
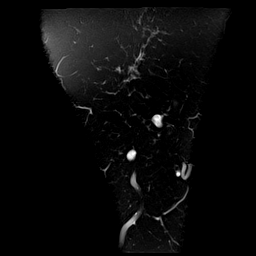

[Series 14: PD · coronal · right · 2.0mm · 0.47mm/px · 2 of 12 slices shown]
[im 1/12]
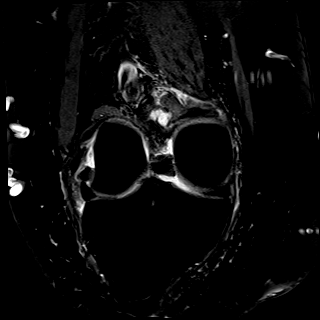
[im 12/12]
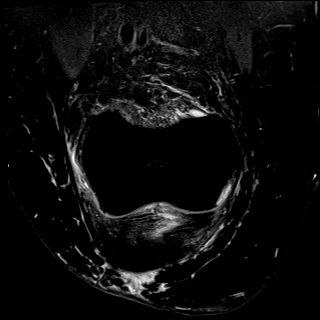

[40 of 40 positions shown; findings below may reference images not displayed]

FINDINGS: MENISCI

Medial: Intact

Lateral: There is blunting of the anterior horn of the lateral
meniscus.

LIGAMENTS

Cruciates: The ACL is intact. The PCL is intact.

Collaterals: There is increased signal seen around the superficial
fibers of the MCL, however it is intact. The lateral collateral
ligamentous complex is intact.

CARTILAGE

Patellofemoral: Chondral fissuring is seen within the lateral
patellar facet and central patellar apex with subchondral cystic
changes.

Medial compartment: Mild chondral thinning seen the weight-bearing
surface of the medial femoral condyle.

Lateral compartment: Chondral thinning is seen within the
weight-bearing surface of the lateral femoral condyle lateral tibial
plateau.

BONES: No fracture. No avascular necrosis. No pathologic marrow
infiltration.

JOINT: There is a moderate knee joint effusion. Edema seen within
Hoffa's fat pad. Prepatellar subcutaneous edema is seen.

EXTENSOR MECHANISM: The patellar and quadriceps tendon are intact.
The retinaculum is unremarkable.

POPLITEAL FOSSA: There is a multilocular popliteal cyst measuring
approximately 2.5 cm with fluid extending posteriorly with evidence
of recent partial rupture. There is edema seen in the posterior
knee.

OTHER:  Feathery signal seen within the popliteus muscle belly.
IMPRESSION: 1. Intact cruciate ligaments and menisci.
2. Tricompartmental osteoarthritis, most notable in the
patellofemoral compartment.
3. Multilocular popliteal cyst with evidence of recent partial
rupture
4. Edema along the prepatellar and posterior knee
5. Popliteus muscle belly edema
6. Grade 1 medial collateral ligamentous sprain
7. Moderate knee joint effusion

## 2021-10-22 DIAGNOSIS — H35033 Hypertensive retinopathy, bilateral: Secondary | ICD-10-CM | POA: Diagnosis not present

## 2021-10-22 DIAGNOSIS — H01021 Squamous blepharitis right upper eyelid: Secondary | ICD-10-CM | POA: Diagnosis not present

## 2021-10-22 DIAGNOSIS — H5213 Myopia, bilateral: Secondary | ICD-10-CM | POA: Diagnosis not present

## 2021-10-22 DIAGNOSIS — H2513 Age-related nuclear cataract, bilateral: Secondary | ICD-10-CM | POA: Diagnosis not present

## 2021-10-22 DIAGNOSIS — H524 Presbyopia: Secondary | ICD-10-CM | POA: Diagnosis not present

## 2021-10-22 DIAGNOSIS — H52223 Regular astigmatism, bilateral: Secondary | ICD-10-CM | POA: Diagnosis not present

## 2021-11-03 DIAGNOSIS — E782 Mixed hyperlipidemia: Secondary | ICD-10-CM | POA: Diagnosis not present

## 2021-11-03 DIAGNOSIS — Z1331 Encounter for screening for depression: Secondary | ICD-10-CM | POA: Diagnosis not present

## 2021-11-03 DIAGNOSIS — Z79899 Other long term (current) drug therapy: Secondary | ICD-10-CM | POA: Diagnosis not present

## 2021-11-03 DIAGNOSIS — Z0000 Encounter for general adult medical examination without abnormal findings: Secondary | ICD-10-CM | POA: Diagnosis not present

## 2021-11-03 DIAGNOSIS — E041 Nontoxic single thyroid nodule: Secondary | ICD-10-CM | POA: Diagnosis not present

## 2021-11-03 DIAGNOSIS — I129 Hypertensive chronic kidney disease with stage 1 through stage 4 chronic kidney disease, or unspecified chronic kidney disease: Secondary | ICD-10-CM | POA: Diagnosis not present

## 2021-11-03 DIAGNOSIS — N1832 Chronic kidney disease, stage 3b: Secondary | ICD-10-CM | POA: Diagnosis not present

## 2021-11-03 DIAGNOSIS — K219 Gastro-esophageal reflux disease without esophagitis: Secondary | ICD-10-CM | POA: Diagnosis not present

## 2021-11-03 DIAGNOSIS — I2510 Atherosclerotic heart disease of native coronary artery without angina pectoris: Secondary | ICD-10-CM | POA: Diagnosis not present

## 2021-11-13 DIAGNOSIS — N1831 Chronic kidney disease, stage 3a: Secondary | ICD-10-CM | POA: Diagnosis not present

## 2021-11-13 DIAGNOSIS — I10 Essential (primary) hypertension: Secondary | ICD-10-CM | POA: Diagnosis not present

## 2021-11-13 DIAGNOSIS — D631 Anemia in chronic kidney disease: Secondary | ICD-10-CM | POA: Diagnosis not present

## 2021-12-02 DIAGNOSIS — M249 Joint derangement, unspecified: Secondary | ICD-10-CM | POA: Diagnosis not present

## 2021-12-02 DIAGNOSIS — M216X1 Other acquired deformities of right foot: Secondary | ICD-10-CM | POA: Diagnosis not present

## 2021-12-02 DIAGNOSIS — L851 Acquired keratosis [keratoderma] palmaris et plantaris: Secondary | ICD-10-CM | POA: Diagnosis not present

## 2021-12-02 DIAGNOSIS — M216X2 Other acquired deformities of left foot: Secondary | ICD-10-CM | POA: Diagnosis not present

## 2021-12-02 DIAGNOSIS — M2011 Hallux valgus (acquired), right foot: Secondary | ICD-10-CM | POA: Diagnosis not present

## 2021-12-02 DIAGNOSIS — M2041 Other hammer toe(s) (acquired), right foot: Secondary | ICD-10-CM | POA: Diagnosis not present

## 2021-12-02 DIAGNOSIS — M21621 Bunionette of right foot: Secondary | ICD-10-CM | POA: Diagnosis not present

## 2021-12-02 DIAGNOSIS — M79672 Pain in left foot: Secondary | ICD-10-CM | POA: Diagnosis not present

## 2021-12-02 DIAGNOSIS — M79671 Pain in right foot: Secondary | ICD-10-CM | POA: Diagnosis not present

## 2021-12-30 DIAGNOSIS — M216X1 Other acquired deformities of right foot: Secondary | ICD-10-CM | POA: Diagnosis not present

## 2021-12-30 DIAGNOSIS — M249 Joint derangement, unspecified: Secondary | ICD-10-CM | POA: Diagnosis not present

## 2021-12-30 DIAGNOSIS — M2011 Hallux valgus (acquired), right foot: Secondary | ICD-10-CM | POA: Diagnosis not present

## 2021-12-30 DIAGNOSIS — M2041 Other hammer toe(s) (acquired), right foot: Secondary | ICD-10-CM | POA: Diagnosis not present

## 2021-12-30 DIAGNOSIS — M79671 Pain in right foot: Secondary | ICD-10-CM | POA: Diagnosis not present

## 2021-12-30 DIAGNOSIS — M216X2 Other acquired deformities of left foot: Secondary | ICD-10-CM | POA: Diagnosis not present

## 2021-12-30 DIAGNOSIS — M79672 Pain in left foot: Secondary | ICD-10-CM | POA: Diagnosis not present

## 2021-12-30 DIAGNOSIS — M21621 Bunionette of right foot: Secondary | ICD-10-CM | POA: Diagnosis not present

## 2021-12-30 DIAGNOSIS — L851 Acquired keratosis [keratoderma] palmaris et plantaris: Secondary | ICD-10-CM | POA: Diagnosis not present

## 2022-03-23 DIAGNOSIS — I10 Essential (primary) hypertension: Secondary | ICD-10-CM | POA: Diagnosis not present

## 2022-03-23 DIAGNOSIS — N1831 Chronic kidney disease, stage 3a: Secondary | ICD-10-CM | POA: Diagnosis not present

## 2022-03-23 DIAGNOSIS — D631 Anemia in chronic kidney disease: Secondary | ICD-10-CM | POA: Diagnosis not present

## 2022-05-06 DIAGNOSIS — I2510 Atherosclerotic heart disease of native coronary artery without angina pectoris: Secondary | ICD-10-CM | POA: Diagnosis not present

## 2022-05-06 DIAGNOSIS — E041 Nontoxic single thyroid nodule: Secondary | ICD-10-CM | POA: Diagnosis not present

## 2022-05-06 DIAGNOSIS — K219 Gastro-esophageal reflux disease without esophagitis: Secondary | ICD-10-CM | POA: Diagnosis not present

## 2022-05-06 DIAGNOSIS — I129 Hypertensive chronic kidney disease with stage 1 through stage 4 chronic kidney disease, or unspecified chronic kidney disease: Secondary | ICD-10-CM | POA: Diagnosis not present

## 2022-05-06 DIAGNOSIS — E782 Mixed hyperlipidemia: Secondary | ICD-10-CM | POA: Diagnosis not present

## 2022-05-06 DIAGNOSIS — R7302 Impaired glucose tolerance (oral): Secondary | ICD-10-CM | POA: Diagnosis not present

## 2022-05-06 DIAGNOSIS — N1832 Chronic kidney disease, stage 3b: Secondary | ICD-10-CM | POA: Diagnosis not present

## 2022-05-06 DIAGNOSIS — Z79899 Other long term (current) drug therapy: Secondary | ICD-10-CM | POA: Diagnosis not present

## 2022-07-23 DIAGNOSIS — I10 Essential (primary) hypertension: Secondary | ICD-10-CM | POA: Diagnosis not present

## 2022-07-23 DIAGNOSIS — N1832 Chronic kidney disease, stage 3b: Secondary | ICD-10-CM | POA: Diagnosis not present

## 2022-07-23 DIAGNOSIS — D631 Anemia in chronic kidney disease: Secondary | ICD-10-CM | POA: Diagnosis not present

## 2022-07-23 DIAGNOSIS — N1831 Chronic kidney disease, stage 3a: Secondary | ICD-10-CM | POA: Diagnosis not present

## 2022-11-06 DIAGNOSIS — Z0000 Encounter for general adult medical examination without abnormal findings: Secondary | ICD-10-CM | POA: Diagnosis not present

## 2022-11-06 DIAGNOSIS — I129 Hypertensive chronic kidney disease with stage 1 through stage 4 chronic kidney disease, or unspecified chronic kidney disease: Secondary | ICD-10-CM | POA: Diagnosis not present

## 2022-11-06 DIAGNOSIS — N1832 Chronic kidney disease, stage 3b: Secondary | ICD-10-CM | POA: Diagnosis not present

## 2022-11-06 DIAGNOSIS — I2510 Atherosclerotic heart disease of native coronary artery without angina pectoris: Secondary | ICD-10-CM | POA: Diagnosis not present

## 2022-11-06 DIAGNOSIS — R7302 Impaired glucose tolerance (oral): Secondary | ICD-10-CM | POA: Diagnosis not present

## 2022-11-06 DIAGNOSIS — E782 Mixed hyperlipidemia: Secondary | ICD-10-CM | POA: Diagnosis not present

## 2022-11-06 DIAGNOSIS — K219 Gastro-esophageal reflux disease without esophagitis: Secondary | ICD-10-CM | POA: Diagnosis not present

## 2022-11-06 DIAGNOSIS — Z23 Encounter for immunization: Secondary | ICD-10-CM | POA: Diagnosis not present

## 2022-11-06 DIAGNOSIS — Z1331 Encounter for screening for depression: Secondary | ICD-10-CM | POA: Diagnosis not present

## 2022-12-03 DIAGNOSIS — I10 Essential (primary) hypertension: Secondary | ICD-10-CM | POA: Diagnosis not present

## 2022-12-03 DIAGNOSIS — N2581 Secondary hyperparathyroidism of renal origin: Secondary | ICD-10-CM | POA: Diagnosis not present

## 2022-12-03 DIAGNOSIS — N1831 Chronic kidney disease, stage 3a: Secondary | ICD-10-CM | POA: Diagnosis not present

## 2022-12-08 DIAGNOSIS — I10 Essential (primary) hypertension: Secondary | ICD-10-CM | POA: Diagnosis not present

## 2022-12-08 DIAGNOSIS — N1832 Chronic kidney disease, stage 3b: Secondary | ICD-10-CM | POA: Diagnosis not present

## 2022-12-08 DIAGNOSIS — R42 Dizziness and giddiness: Secondary | ICD-10-CM | POA: Diagnosis not present

## 2022-12-08 DIAGNOSIS — R609 Edema, unspecified: Secondary | ICD-10-CM | POA: Diagnosis not present

## 2022-12-08 DIAGNOSIS — E871 Hypo-osmolality and hyponatremia: Secondary | ICD-10-CM | POA: Diagnosis not present

## 2022-12-08 DIAGNOSIS — R7302 Impaired glucose tolerance (oral): Secondary | ICD-10-CM | POA: Diagnosis not present

## 2022-12-08 DIAGNOSIS — K449 Diaphragmatic hernia without obstruction or gangrene: Secondary | ICD-10-CM | POA: Diagnosis not present

## 2022-12-08 DIAGNOSIS — H539 Unspecified visual disturbance: Secondary | ICD-10-CM | POA: Diagnosis not present

## 2022-12-08 DIAGNOSIS — R2689 Other abnormalities of gait and mobility: Secondary | ICD-10-CM | POA: Diagnosis not present

## 2022-12-08 DIAGNOSIS — R5381 Other malaise: Secondary | ICD-10-CM | POA: Diagnosis not present

## 2022-12-08 DIAGNOSIS — R062 Wheezing: Secondary | ICD-10-CM | POA: Diagnosis not present

## 2022-12-08 DIAGNOSIS — R6889 Other general symptoms and signs: Secondary | ICD-10-CM | POA: Diagnosis not present

## 2023-01-05 DIAGNOSIS — Z79899 Other long term (current) drug therapy: Secondary | ICD-10-CM | POA: Diagnosis not present

## 2023-01-05 DIAGNOSIS — E871 Hypo-osmolality and hyponatremia: Secondary | ICD-10-CM | POA: Diagnosis not present

## 2023-01-22 DIAGNOSIS — R944 Abnormal results of kidney function studies: Secondary | ICD-10-CM | POA: Diagnosis not present

## 2023-02-19 DIAGNOSIS — E871 Hypo-osmolality and hyponatremia: Secondary | ICD-10-CM | POA: Diagnosis not present

## 2023-02-19 DIAGNOSIS — I10 Essential (primary) hypertension: Secondary | ICD-10-CM | POA: Diagnosis not present

## 2023-02-19 DIAGNOSIS — Z6839 Body mass index (BMI) 39.0-39.9, adult: Secondary | ICD-10-CM | POA: Diagnosis not present

## 2023-02-19 DIAGNOSIS — N1832 Chronic kidney disease, stage 3b: Secondary | ICD-10-CM | POA: Diagnosis not present

## 2023-02-19 DIAGNOSIS — Z79899 Other long term (current) drug therapy: Secondary | ICD-10-CM | POA: Diagnosis not present

## 2023-04-21 DIAGNOSIS — N1832 Chronic kidney disease, stage 3b: Secondary | ICD-10-CM | POA: Diagnosis not present

## 2023-04-21 DIAGNOSIS — N2581 Secondary hyperparathyroidism of renal origin: Secondary | ICD-10-CM | POA: Diagnosis not present

## 2023-04-21 DIAGNOSIS — I10 Essential (primary) hypertension: Secondary | ICD-10-CM | POA: Diagnosis not present

## 2023-04-21 DIAGNOSIS — N1831 Chronic kidney disease, stage 3a: Secondary | ICD-10-CM | POA: Diagnosis not present

## 2023-05-07 DIAGNOSIS — R7302 Impaired glucose tolerance (oral): Secondary | ICD-10-CM | POA: Diagnosis not present

## 2023-05-07 DIAGNOSIS — E782 Mixed hyperlipidemia: Secondary | ICD-10-CM | POA: Diagnosis not present

## 2023-05-07 DIAGNOSIS — Z79899 Other long term (current) drug therapy: Secondary | ICD-10-CM | POA: Diagnosis not present

## 2023-05-07 DIAGNOSIS — I2510 Atherosclerotic heart disease of native coronary artery without angina pectoris: Secondary | ICD-10-CM | POA: Diagnosis not present

## 2023-05-07 DIAGNOSIS — N1832 Chronic kidney disease, stage 3b: Secondary | ICD-10-CM | POA: Diagnosis not present

## 2023-05-07 DIAGNOSIS — Z6839 Body mass index (BMI) 39.0-39.9, adult: Secondary | ICD-10-CM | POA: Diagnosis not present

## 2023-05-07 DIAGNOSIS — I129 Hypertensive chronic kidney disease with stage 1 through stage 4 chronic kidney disease, or unspecified chronic kidney disease: Secondary | ICD-10-CM | POA: Diagnosis not present

## 2023-05-07 DIAGNOSIS — K219 Gastro-esophageal reflux disease without esophagitis: Secondary | ICD-10-CM | POA: Diagnosis not present

## 2023-06-14 DIAGNOSIS — Z0100 Encounter for examination of eyes and vision without abnormal findings: Secondary | ICD-10-CM | POA: Diagnosis not present

## 2023-06-14 DIAGNOSIS — H2513 Age-related nuclear cataract, bilateral: Secondary | ICD-10-CM | POA: Diagnosis not present

## 2023-06-14 DIAGNOSIS — H01021 Squamous blepharitis right upper eyelid: Secondary | ICD-10-CM | POA: Diagnosis not present

## 2023-06-14 DIAGNOSIS — H43813 Vitreous degeneration, bilateral: Secondary | ICD-10-CM | POA: Diagnosis not present

## 2023-06-14 DIAGNOSIS — H524 Presbyopia: Secondary | ICD-10-CM | POA: Diagnosis not present

## 2023-06-14 DIAGNOSIS — H35033 Hypertensive retinopathy, bilateral: Secondary | ICD-10-CM | POA: Diagnosis not present

## 2023-06-14 DIAGNOSIS — H52223 Regular astigmatism, bilateral: Secondary | ICD-10-CM | POA: Diagnosis not present

## 2023-06-14 DIAGNOSIS — H5213 Myopia, bilateral: Secondary | ICD-10-CM | POA: Diagnosis not present

## 2023-08-30 DIAGNOSIS — N1832 Chronic kidney disease, stage 3b: Secondary | ICD-10-CM | POA: Diagnosis not present

## 2023-08-30 DIAGNOSIS — I10 Essential (primary) hypertension: Secondary | ICD-10-CM | POA: Diagnosis not present

## 2023-08-30 DIAGNOSIS — N2581 Secondary hyperparathyroidism of renal origin: Secondary | ICD-10-CM | POA: Diagnosis not present

## 2023-08-30 NOTE — Progress Notes (Signed)
 Follow Up Visit   Patient Name: Veronica Moon, female   Patient DOB: 07-07-1945 Date of Service: 08/30/2023  Patient MRN: [id number] Provider Creating Note: Bonnell Sherry, MD  [phone] Primary Care Physician:   [address] [place] [address] Additional Physicians/ Providers:    History of Present Illness Veronica Moon is a 78 y.o. female who is following up today for chronic kidney disease stage IIIa, hypertension, and mild convexity of the left kidney.  The patient's chronic kidney disease remained stable with most recent EGFR 42.  In regards hypertension blood pressure currently 126/80.  Patient also has secondary hyperparathyroidism with most recent PTH of 97, phosphorus 4.0, calcium 9.1.  Patient denies nausea, vomiting, or dysphagia.  Medications   Current Outpatient Medications:    amLODIPine (NORVASC) 5 MG tablet, , Disp: , Rfl:    atorvastatin (LIPITOR) 20 MG tablet, Take 1 tablet by mouth 1 (one) time each day, Disp: , Rfl:    cholecalciferol  (VITAMIN D -3) 25 MCG (1000 UT) tablet, Take 1,000 Units by mouth 1 (one) time each day, Disp: , Rfl:    ferrous sulfate  325 (65 Fe) MG EC tablet, Take 325 mg by mouth in the morning., Disp: , Rfl:    losartan (COZAAR) 50 MG tablet, Take 100 mg by mouth in the morning., Disp: , Rfl:    torsemide (DEMADEX) 20 MG tablet, Take 20 mg by mouth in the morning., Disp: , Rfl:    Allergies Meperidine and Codeine  Problem List Patient Active Problem List  Diagnosis   Chronic kidney disease   Anemia     Review of Systems  Constitutional:  Negative for chills and fever.  Respiratory:  Negative for cough and shortness of breath.   Cardiovascular:  Negative for chest pain and palpitations.  Gastrointestinal:  Negative for nausea and vomiting.  Genitourinary:  Negative for dysuria, hematuria and urgency.     History Past Medical History:  Diagnosis Date   Anemia    Chronic kidney disease    Gastric erosion     Melena    Other malignant neoplasm of unspecified site [facility]     Past Surgical History:  Procedure Laterality Date   TUBAL LIGATION     Family History  Problem Relation Age of Onset   Stroke Mother    Heart disease Father    Social History   Tobacco Use   Smoking status: Never   Smokeless tobacco: Not on file  Substance Use Topics   Alcohol use: Never        Physical Exam  Vitals BP 126/80 (BP Location: Left upper arm, Patient Position: Sitting)   Pulse 62   Temp 98.1 F   Wt 230 lb (104 kg)   SpO2 98%   BMI 38.87 kg/m   PHYSICAL EXAM: General appearance: well developed, well nourished, NAD Eyes: anicteric sclerae, moist conjunctivae; no lid-lag  HENT: Atraumatic; hearing intact Neck: Trachea midline; supple Lungs: CTAB, with normal respiratory effort  CV: S1S2, no murmurs or rubs. Abdomen: Soft, non-tender; bowel sounds present Extremities: No peripheral edema Skin: Warm and dry, normal skin turgor, no rashes noted. Psych: Appropriate affect, alert and oriented to person, place and time    Laboratory Studies  Chemistry  Lab Units 04/21/23 1427 04/21/23 1426 02/19/23 1045 01/22/23 0948 01/05/23 1618 12/08/22 1626 12/03/22 1421 07/23/22 1354 05/06/22 1013 03/23/22 1507 11/13/21 1354  SODIUM mmol/L  --  136 137 139 135* 128* 128* 133*  --  135 135  POTASSIUM mmol/L  --  4.2 4.8 4.7 4.5  --  4.4 4.4  --  4.4 4.6  CHLORIDE mmol/L  --  101 100 103 100  --  95* 99  --  99 101  CO2 mmol/L  --  24 28.4 27.6 29.5  --  21 20  --  27 21  ANION GAP   --   --  13.4 13.1  --   --   --   --   --   --   --   MAGNESIUM mg/dL 2.1  --   --   --  1.8 1.6*  --   --   --   --   --   CALCIUM mg/dL  --  9.1 9.8 9.7 9.8  --  10.1 9.7  --  10.1 9.9  PHOSPHORUS mg/dL  --  4.0  --   --  4.0  --  4.1 3.8  --  3.7 3.2  PTH pg/mL  --  97*  --   --   --   --  60 76  --  84* 50  GLUCOSE mg/dL  --  892* 97 894 92  --  99 97  --  94 84  ALBUMIN g/dL  --  4.0  --   --  4.3   --  4.2 4.4  --  4.4 4.3  BUN mg/dL  --  25 27* 25 21  --  26* 27*  --  22 20  CREATININE mg/dL  --  8.68* 1.4* 1.3* 1.5*  --  1.19* 1.23*  --  1.31* 1.22*  HEMOGLOBIN A1C %  --   --   --   --   --  5.9*  --   --  6.0*  --   --         No lab exists for component: IRON  SATURATION, TRANSSATPER  CBC  Lab Units 04/21/23 1426 12/03/22 1421 07/23/22 1354 03/23/22 1507 11/13/21 1354  WBC AUTO Thousand/uL 5.5 8.6 5.3 5.3 5.9  HEMOGLOBIN g/dL 88.1 87.3 86.5 87.7 88.5*  HEMATOCRIT % 35.1 38.1 40.3 37.0 34.0*  MCV fL 88.9 93.8 90.2 90.2 91.2  PLATELETS AUTO Thousand/uL 277 308 285 292 258    Urine  Lab Units 04/21/23 1426 12/03/22 1421 07/23/22 1354 03/23/22 1507 11/13/21 1354  ALB MG/G CREAT UR mg/g creat NOTE 5 9 10 13     Lab Results  Component Value Date   PTH 97 (H) 04/21/2023   CALCIUM 9.1 04/21/2023   PHOS 4.0 04/21/2023     Imaging and Other Studies  06/06/2020: Negative SPEP/UPEP/ANA. 07/01/2020 renal ultrasound: Right kidney 8.3 cm, left kidney 9.2 cm.  Mild convexity to the lateral aspect of the left kidney.  Radiology recommended MRI.   Orders Placed This Encounter   CBC and Differential   Renal Function Panel   PTH, Intact   Urine Albumin / Creatinine Ratio        Impression/Recommendations  Veronica Moon is a 78 y.o. female with past medical history of hypertension, hyperlipidemia, history of anemia, history of peptic ulcer disease who was referred the evaluation of chronic kidney disease stage IIIb.   1.  Chronic kidney disease stage IIIB.  The patient's chronic kidney disease remained stable with most recent EGFR of 42.  Patient will maintain on losartan with plan to follow-up renal parameters today.  2.  Hypertension.  Blood pressure under good control at 126/80.  Patient will be maintained on losartan and amlodipine at the current doses.  3.  Mild convexity of the left kidney.  Patient found to have lobulated renal contours.  Patient  unable to tolerate MRI previously.  4.  Secondary hyperparathyroidism.  Most recent PTH was 97, phosphorus 4.0, calcium 9.1.  No immediate need for calcitriol however patient will be maintained on cholecalciferol .  Follow-up PTH, phosphorus, calcium level today.  Return in about 4 months (around 12/30/2023).   Munsoor Lateef, MD

## 2023-11-16 DIAGNOSIS — E782 Mixed hyperlipidemia: Secondary | ICD-10-CM | POA: Diagnosis not present

## 2023-11-16 DIAGNOSIS — Z1331 Encounter for screening for depression: Secondary | ICD-10-CM | POA: Diagnosis not present

## 2023-11-16 DIAGNOSIS — I129 Hypertensive chronic kidney disease with stage 1 through stage 4 chronic kidney disease, or unspecified chronic kidney disease: Secondary | ICD-10-CM | POA: Diagnosis not present

## 2023-11-16 DIAGNOSIS — N1832 Chronic kidney disease, stage 3b: Secondary | ICD-10-CM | POA: Diagnosis not present

## 2023-11-16 DIAGNOSIS — Z0000 Encounter for general adult medical examination without abnormal findings: Secondary | ICD-10-CM | POA: Diagnosis not present

## 2023-11-16 DIAGNOSIS — R7302 Impaired glucose tolerance (oral): Secondary | ICD-10-CM | POA: Diagnosis not present

## 2023-11-16 DIAGNOSIS — Z23 Encounter for immunization: Secondary | ICD-10-CM | POA: Diagnosis not present

## 2023-11-16 DIAGNOSIS — I2510 Atherosclerotic heart disease of native coronary artery without angina pectoris: Secondary | ICD-10-CM | POA: Diagnosis not present

## 2023-11-16 NOTE — Progress Notes (Signed)
 ID Data:  Veronica Moon, 78 y.o. female  CC:   Chief Complaint  Patient presents with   Annual Exam    HPI:  Presents today for Medicare Annual Wellness Visit, as well as addressing the additional issues listed below.  is fasting for labs today.  does need refills on medications today & would like 90 day supply of meds.  History of Present Illness Her anxiety is well-controlled, requiring hydroxyzine only occasionally, sometimes going two months without taking it. She does not identify any specific triggers for her anxiety, and the medication is effective when needed.  Doesn't identify any additional coping skills.  She experiences occasional dizziness and lightheadedness, particularly in the mornings, describing a sensation of being 'a little off balance' localized to her temples. She maintains hydration with at least 40 ounces of water and 20 ounces of Gatorade daily. She questions whether her morning symptoms could be related to hydration levels, but she does drink during the night when she gets up to urinate.  She is having difficulty adjusting to new bifocal glasses, finding them challenging to wear. She reports perfect vision for farsightedness w/ her glasses but struggles with the bifocal portion, preferring to use 2.50 strength readers around the house instead.  She takes losartan and amlodipine, usually around 11 AM, but took them earlier today due to the appointment. She does report that all of her dizziness sx resolve in the afternoon.  She discusses her routine and limitations due to aging, noting that she has slowed down and requires assistance from her daughters for yard work. She has cameras installed outside for safety monitoring by her daughters.  She is frustrated and upset by how much she has had to slow down in all of her work, inside & outside, as she has always been a person that has just gone, gone, gone.  1.  HTN w/ CKD             This is a chronic stable  complicated condition; co-managed by Dr. Marcelino in nephrology w/ last appt 8/25 & no changes made--all labs were stable.  Seen notes from 12/08/22, 01/05/23 & 02/19/23 for more details.  Currently being managed on norvasc, torsemide & losartan daily and is taking meds regularly as prescribed.  Denies any side effects from medications.  Hm BP monitoring:  None.               As part of her CKD wrkp she had renal uls that showed a mild convexity to the lateral aspect of the Lt kidney w/ radiology recs for MRI, but pt has declined.  Had repeat renal ULS in 10/22 showing stable findings, so Dr. Marcelino just plans to do periodic monitoring.  Rest of wrkp negative for 2/2 cause of CKD.   2.  HLD w/ CAD             This is a chronic stable controlled condition.  Currently being managed on lipitor 20 mg and is taking meds regularly as prescribed.  Denies any side effects from medications.  ASCVD risk score 28.6%.            CAD was noted incidentally on CT imaging of chest & cardiology notes make reference to it, but pt denies knowing about this dx prior to our discussion 1/20.  Cardiology did order stress testing in 2018, but pt cancelled this, because of cost & didn't like cardiology provider.  Did have echo done in 2017 that was nml.  3.  Gastric ulcers/UGIB/GERD/Anemia             Admitted to [facility] in 2016, 2017 & 11/19 for UGIBs.  Most recent notes indicate latest bleed was NSAID induced.  In 2016 was seen by Dr. Geryl, in 2017 by Dr. Jinny & most recently by Dr. Janalyn.  EGD's have showed gastris & ulcers.  Had EGD/colonoscopy 2/20 w/o signs of malignancy.  No further flu appts scheduled/needed at this time.  Not taking any more NSAID's; hasn't even taken tylenol  in can't even remember.  Hasn't had any heartburn/indigestion since last EGD.             Iron  deficiency anemia is the result of blood loss from all of her GIB's; is taking oral iron  daily.  Previously followed by Dr. Tatiana in heme-onc, w/ last  03/07/19, but note is not available.  Last iron  infusion 1/20.  Per pt she has been released from hematology care unless further issues develop in the future.   4.  IGT             This is a chronic stable controlled condition.  Currently being managed on no meds. A1c has remained stable around 5.8%.  Current Medical Providers and Suppliers: [facility] Patient Care Team: Gauger, Lauraine Collar, NP as PCP - General (Internal Medicine) Dermatology, [place] (Inactive) as Co-Managing Provider (Dermatology) Lateef, Munsoor, MD (Nephrology) Care, [facility] (Ophthalmology) Dentist:  [facility]  Health Risk Assessment:    11/16/2023  HRA  _   HEALTH STATUS   Have you been hospitalized recently? No  Do you exercise? No*  How would you rate your diet? Balanced   *   2 meals QD, not fruits/veggies daily, 1 cup sweet or unsweet QD, 1 glass sweet tea QD,  Rarely regular soda, 40 oz water QD, 20 oz regular gatorade QD.  __   DIFFICULTY WITH   Bathing No  Dressing No  Toileting No  Transferring from bed, chair, etc. No  Bowel or Bladder control No  Feeding No  ___   HOME   Rugs in the hallway? No  Grab bars in the bathroom? Yes  Stairs inside the home? No  Handrails on the stairs? No*  Poor lighting? No  _____   HEARING   Do you have trouble hearing the television when others do not? No  Do you have to strain to hear conversations? No  ______   ADVANCED DIRECTIVE   Do you have an advance directive? (Living Will and/or Healthcare Power of Attorney) No*  If not, are you interested in receiving information about setting one up? Yes*  _______   HRA QUESTIONS   Do you have problems with your memory? No  In the past 4 weeks, was someone available to help you if you were having difficulties? Yes  Are you able to get to the places you need to go by driving or using public transportation? Yes  Do you always wear your seatbelt? Yes  Are you able to do your shopping independently? Yes  Are you  able to manage your own finances? Yes  Are you able to manage your own medications (knowing what medications are for and remembering to take them)? Yes  Are you able to use the telephone without difficulty? Yes  Are you able to do your housework and cooking independently? Yes  Do you have problems with your teeth or dentures? No  Do you have problems with sexual function (if applicable)? No  Do you take Opioid medication? No  How would you rate your overall health? Fair  Do you have any other concerns about your health or safety? If so, please comment: none    ROS: Gen:  +Baseline fatigue/loss of energy.  Denies fever, wt changes. Head:  +Dizziness--a touch of lightheadedness this am & feels more off balance; sx aren't daily, but are often.  Denies HA's. Eyes:  Denies visual changes.  Wears glasses.  Last eye exam:  up to date, 5/25 Ears:  Denies ear pain. Nose:  Denies frequent colds, allergies or nasal congestion.  Denies sinus pain. Mouth:  Denies sore throat or dental issues.  Last dental exam:  up to date, 12/24 Resp:  Denies SOB, cough or orthopnea. CV:  Denies CP or LE edema. Breasts:  Denies masses or lesions.    Last mammogram:  not up to date, pt declines GI:  Denies N/V/D/C.  Denies heartburn.  Denies blood in stool.  Last colonoscopy:  up to date, 2020, not required anymore  GU:  Denies frequency or burning w/ urination.  Denies frequent nocturia. MS:  Denies muscle or joint pain or limited ROM. Neuro:  Denies numbness/tingling to bilat LE's. Endo:  Denies heat or cold intolerance.  Denies polyuria or polydipsia. Skin:  Denies concerning skin lesions.  Last dermatology:  ~3 yrs Psych:    PHQ-2  PHQ-2 Over the last 2 weeks, how often have you been bothered by any of the following problems? Little interest or pleasure in doing things: Not at all Feeling down, depressed, or hopeless: Not at all Patient Health Questionnaire-2 Score: 0   PHQ-9 (Only completed if PHQ-2  >=3)      GAD 7 result:     01/05/2023    3:43 PM  GAD-7 w/ Score  (OBSOLETE) Over the past 2 weeks, have you felt nervous, anxious, or on edge? Not at all *  (OBSOLETE) Over the past 2 weeks, have you not been able to stop or control worrying? Not at all *  (OBSOLETE) Worrying too much about different things Not at all *  (OBSOLETE) Trouble relaxing Not at all *  (OBSOLETE) Being so restless that it is hard to sit still Not at all *  (OBSOLETE) Becoming easily annoyed or irritable Not at all *  (OBSOLETE) Feeling afraid as if something awful might happen Not at all *  (OBSOLETE) GAD-7 Total Score 0 *  --   (OBSOLETE) How difficult have these problems made it for you to do your work, take care of things at home, or get along with other people? Not difficult at all    * Data saved with a previous flowsheet row definition    Immunizations: Immunization History  Administered Date(s) Administered   Flu Vaccine MDCK IIV3 (egg free), IM PF, (62MO+)(Flucelvax) 11/06/2022   PNEUMOCOCCAL (PPSV23)(>=694YRS -OR- >=2 YRS WITH RISK) VACCINE (PNEUMOVAX 23) 09/06/2018   Pneumococcal (PCV20) (>=6WKS) vaccine (Prevnar 20) (aka PCV 20) 01/23/2021   TDAP (>=94YR) VACCINE (ADACEL/BOOSTRIX) 06/03/2021    Health Maintenance: Health Maintenance Topics with due status: Overdue     Topic Date Due   Shingrix Never done   RSV Immunization Pregnant or 60+ Never done   Lipid Panel 05/06/2023   COVID-19 Vaccine Never done   Health Maintenance Topics with due status: Due On     Topic Date Due   Influenza Vaccine 09/27/2023   Health Maintenance Topics with due status: Not Due     Topic Last Completion Date  Colorectal Cancer Screening 03/23/2018   DXA Bone Density Scan 11/02/2019   Adult Tetanus (Td And Tdap) 06/03/2021   Diabetes Screening 12/08/2022   Serum Phosphorus 01/05/2023   Potassium Level 02/19/2023   Serum Bicarbonate 02/19/2023   Serum Calcium 02/19/2023   Creatinine Level  04/21/2023   Annual Urine Albumin Creatinine Ratio 04/21/2023   Parathyroid Hormone 04/21/2023   Medicare Subsequent AWV [id number] 11/16/2023   Depression Screening 11/16/2023   Annual Physical/Well Child Check 11/16/2023   Health Maintenance Topics with due status: Completed     Topic Last Completion Date   Hepatitis C Screen 02/24/2018   Pneumococcal Vaccine: 50+ 01/23/2021   Health Maintenance Topics with due status: Aged Out     Topic Date Due   Hib Vaccines Aged Out   Hepatitis A Vaccines Aged Out   Meningococcal B Vaccine Aged Out   Meningococcal ACWY Vaccine Aged Out   HPV Vaccines Aged Out    Current Medications: Outpatient Medications Marked as Taking for the 11/16/23 encounter (Office Visit) with Don, Lauraine Collar, NP  Medication Sig Dispense Refill   amLODIPine (NORVASC) 5 MG tablet TAKE 1 TABLET BY MOUTH EVERY DAY 90 tablet 1   atorvastatin (LIPITOR) 20 MG tablet TAKE 1 TABLET BY MOUTH EVERY DAY 90 tablet 1   cholecalciferol , vitamin D3, (VITAMIN D3) 125 mcg (5,000 unit) tablet Take 5,000 Units by mouth once daily     cyanocobalamin  (VITAMIN B12) 1000 MCG tablet Take 1,000 mcg by mouth once daily        ferrous sulfate  325 (65 FE) MG tablet TAKE 1 TABLET BY MOUTH ONCE DAILY. 90 tablet 1   hydrOXYzine (ATARAX) 25 MG tablet Take 0.5-1 tablets (12.5-25 mg total) by mouth 3 (three) times daily as needed for Anxiety 30 tablet 0   losartan (COZAAR) 50 MG tablet TAKE 2 TABLETS (100 MG TOTAL) BY MOUTH ONCE DAILY 180 tablet 1   MAGNESIUM GLYCINATE ORAL Take 720 mg by mouth once daily     TORsemide (DEMADEX) 20 MG tablet TAKE 1 TABLET BY MOUTH EVERY DAY (Patient taking differently: Take 20 mg by mouth 3 (three) times a week) 90 tablet 1   [DISCONTINUED] hydrOXYzine (ATARAX) 25 MG tablet Take 0.5-1 tablets (12.5-25 mg total) by mouth 3 (three) times daily as needed for Anxiety 30 tablet 0    Allergies: Allergies as of 11/16/2023 - Reviewed 11/16/2023  Allergen  Reaction Noted   Hydrochlorothiazide  (bulk) Other (See Comments) 02/19/2023   Meperidine Itching 07/02/2015   Nsaids (non-steroidal anti-inflammatory drug) Other (See Comments) 02/24/2018   Codeine Itching 12/05/2017    Past Medical/Surgical History: Past Medical History:  Diagnosis Date   Anxiety 04/24/2021   Basal cell carcinoma (BCC) of right lower extremity 2019   s/p excision   CAD (coronary artery disease) 2018   3 vessel CAD noted on CT scan   CKD (chronic kidney disease) stage 3, GFR 30-59 ml/min (CMS-HCC)    Gastric ulcer 2017   Requiring hospitalization due to sx.   Gastritis 2016   Noted on EGD   GERD (gastroesophageal reflux disease)    Hiatal hernia 2018   Noted on CT scan   History of blood transfusion 2016 & 2019   Hyperlipidemia 01/2018   Hypertension    Iron  deficiency anemia    r/t blood loss from multiple GI events.  11/19 being followed by heme for infusion therapy.  2/21 Released from heme care given stability.   Osteopenia 10/2019   Thyroid  nodule 2018  11 x 8 mm hypodense Rt thyroid  nodule, incidental finding on CT.   UGI bleed 03/2014   s/p multiple PRBC   UGI bleed 11/2017   Likely r/t NSAID use   Past Surgical History:  Procedure Laterality Date   EGD  04/24/2014   COLONOSCOPY  04/27/2014   (Inpt) Hyperplastic Polyp: CBF 04/2014   EGD  07/03/2015   Gastric ulcers.  Dr. Jinny.   Basal Cell Ca Removal Right 10/2017   Rt shin   EGD  12/05/2017   Non-bleeding erosive gastropathy.  Single gastric polyp.   COLONOSCOPY  03/23/2018   EGD  03/23/2018   CHOLECYSTECTOMY     SIGMOIDOSCOPY     TUBAL LIGATION      Problem List: Patient Active Problem List  Diagnosis   Benign essential HTN   CAD (coronary artery disease)   Iron  deficiency anemia   GERD (gastroesophageal reflux disease)   Hyperlipidemia   CKD (chronic kidney disease) stage 3, GFR 30-59 ml/min (CMS-HCC)   Anxiety    Family History: Family  History  Problem Relation Name Age of Onset   Myocardial Infarction (Heart attack) Father     High blood pressure (Hypertension) Father     No Known Problems Daughter Jerel Seltzer    No Known Problems Daughter Danelle Mccook    Multiple sclerosis Daughter Jori    Obesity Daughter Jori        s/p gastric bypass w/ multiple complications   High blood pressure (Hypertension) Son Salomon    Arthritis Son Salomon        Unclear OA vs RA   High blood pressure (Hypertension) Son Ludie    Alzheimer's disease Paternal Aunt     Alzheimer's disease Paternal Aunt     Alzheimer's disease Paternal Aunt      Social History: Social History   Socioeconomic History   Marital status: Divorced   Number of children: 5   Years of education: Didn't graduate HS  Occupational History   Occupation: Retired    Comment: Education Officer, Environmental work  Tobacco Use   Smoking status: Never    Passive exposure: Never   Smokeless tobacco: Never  Vaping Use   Vaping status: Never Used  Substance and Sexual Activity   Alcohol use: No   Drug use: No   Sexual activity: Not Currently    Partners: Male   Social Drivers of Health   Financial Resource Strain: Low Risk  (11/16/2023)   Overall Financial Resource Strain (CARDIA)    Difficulty of Paying Living Expenses: Not hard at all  Food Insecurity: No Food Insecurity (11/16/2023)   Hunger Vital Sign    Worried About Running Out of Food in the Last Year: Never true    Ran Out of Food in the Last Year: Never true  Transportation Needs: No Transportation Needs (11/16/2023)   PRAPARE - Administrator, Civil Service (Medical): No    Lack of Transportation (Non-Medical): No  Housing Stability: Low Risk  (11/16/2023)   Housing Stability Vital Sign    Unable to Pay for Housing in the Last Year: No    Number of Times Moved in the Last Year: 0    Homeless in the Last Year: No    Objective: BP 134/78 (BP Location: Left upper arm,  Patient Position: Sitting, BP Cuff Size: Large Adult)   Pulse 57   Ht 164.8 cm (5' 4.9)   Wt (!) 104.1 kg (229 lb 9.6 oz)   SpO2 99%  BMI 38.33 kg/m  BP Readings from Last 10 Encounters:  11/16/23 134/78  05/07/23 128/72  02/19/23 120/82  01/05/23 138/76  12/08/22 (!) 156/86  11/06/22 (!) 154/86  05/06/22 (!) 144/78  11/03/21 124/82  04/24/21 (!) 146/76  10/24/20 (!) 154/94   Wt Readings from Last 10 Encounters:  11/16/23 (!) 104.1 kg (229 lb 9.6 oz)  05/07/23 (!) 103.2 kg (227 lb 9.6 oz)  02/19/23 (!) 104.1 kg (229 lb 6.4 oz)  01/05/23 (!) 102.3 kg (225 lb 9.6 oz)  12/08/22 (!) 105 kg (231 lb 6.4 oz)  11/06/22 (!) 101 kg (222 lb 9.6 oz)  05/06/22 (!) 102.5 kg (226 lb)  12/30/21 (!) 101.2 kg (223 lb 1.7 oz)  12/02/21 (!) 101.2 kg (223 lb 1.7 oz)  11/03/21 (!) 101.2 kg (223 lb 3.2 oz)    Gen:  WDWN female sitting in chair in NAD. Eyes:  Sclera clear.  PERRLA.  EOMI. Ears:  Bilat canals clear.  Bilat TM's pearly gray. Nose:  No congestion. Mouth:  Mucous membranes moist.  Post-pharynx clear.  Teeth w/o active dental disease; has full upper denture. Neck:  No cervical lymphadenopathy.  Thyroid  not enlarged or tender. Resp:  Even & unlabored respirations.  Lungs CTA A&P. CV:  S1S2 RRR, no murmur, rub, gallop.  No carotid bruits.  2+ radial pulses bilat.  No LE edema. Abd:  BS x4.     Neuro:  A&O x3.  Mood & affect appropriate to the situation.  Cranial nerves II-XII grossly intact.  DTR's at triceps, biceps, brachioradialis & achilles 2+/4+ bilat.  Gait smooth & coordinated.   MS:  No skeletal deformities.  Moves all extremities equally.  Last Labs:   Recent Labs    04/24/21 1158 05/06/22 1013  CHOLTOTAL 191 186  HDL 85.5* 80.0  LDLCALC 82 77  VLDL 23 29  TRIG 116 147    Recent Labs    01/05/23 1618 01/22/23 0948 02/19/23 1045  NA 135* 139 137  K 4.5 4.7 4.8  BUN 21 25 27*  CREATININE 1.5* 1.3* 1.4*  CO2 29.5 27.6 28.4  GLUCOSE 92 105 97  ALB 4.3  --    --     No results for input(s): WBC, HGB, HCT, MCV, PLT in the last 73719 hours.  Recent Labs    04/24/21 1158 05/06/22 1013 12/08/22 1626  TSH 2.265 1.989 1.569  HGBA1C 5.8* 6.0* 5.9*  VITB12  --   --  >1,500    Recent Labs    12/08/22 1626  VITB12 >1,500    No results found for: URICACID    Assessment: Encounter Diagnoses  Name Primary?   Well adult exam Yes   Encounter for Medicare annual wellness exam    Essential hypertension    Stage 3b chronic kidney disease (CMS-HCC)    IGT (impaired glucose tolerance)    Mixed hyperlipidemia    Coronary artery disease involving native coronary artery of native heart without angina pectoris    Gastroesophageal reflux disease without esophagitis    Encounter for long-term (current) use of medications    Severe obesity (BMI 35.0-39.9) with comorbidity (CMS-HCC)    Depression screening (Z13.31)    Need for vaccination    Anxiety     Identification of Risk Factors:   Risk factors include: weight , cardiovascular risk, and inactivity  Assessment & Plan Dizziness and balance disturbance Intermittent lightheadedness and balance disturbance, primarily in the morning. Possible contributors include hydration status, blood pressure medication timing, and  vision issues. Discussed potential impact of vision correction on balance. - Take blood pressure medications earlier in the morning - Practice wearing new glasses to improve vision and balance - Consider referral to physical therapy if symptoms persist  Essential hypertension Blood pressure is well-controlled with current medication regimen. Discussed potential impact of medication timing on morning dizziness. - Consider taking blood pressure medications earlier in the morning to assess impact on dizziness; suggest she take at 5:30-6 am when she normally awakes to go to the bathroom.  Chronic kidney disease, stage 3b Kidney function is consistent with previous  results with EGFR of 42. Hydration is important for kidney health. - Continue hydration as advised by Dr. Marcelino - Monitor kidney function regularly with Dr. Marcelino  Coronary artery disease No new symptoms reported. - Continue current medications as prescribed  Mixed hyperlipidemia Continue current management plan with atorvastatin. No new concerns reported. - Continue atorvastatin as prescribed - Check cholesterol levels today  Anxiety disorder Anxiety is well-controlled with infrequent use of hydroxyzine. No specific triggers identified. Effective relief when medication is used. - Refill hydroxyzine prescription - Continue as needed use of hydroxyzine  Recording duration: 33 minutes  Plan: Medications:  1.  Requested refill sent in: Requested Prescriptions   Signed Prescriptions Disp Refills   hydrOXYzine (ATARAX) 25 MG tablet 30 tablet 0    Sig: Take 0.5-1 tablets (12.5-25 mg total) by mouth 3 (three) times daily as needed for Anxiety   2.  Continue all meds as currently written.  Non-Pharmacologic:  1.  Continue to pace activities as needed.  Labs/Imaging/Referrals:  1.   Orders Placed This Encounter  Procedures   FLU VACCINE MDCK IIV3 (EGG FREE), IM PF, (48MO+)(FLUCELVAX)   Hemoglobin A1C    Release to patient:   Immediate   Lipid Panel w/calc LDL    Release to patient:   Immediate   Thyroid  Stimulating-Hormone (TSH)    Release to patient:   Immediate   Depression Screen -(PHQ- 2/9, BDI)   2.  Continue to flu w/ specialty providers as directed.  Teaching (an after visit summary with their plan was given to the patient or is available to them through MyChart):  1.  Patient Self-Management and Personalized Health Advice: The patient has been provided with information about: designing advance directives  2.  Discussed the patient's BMI with her. The BMI is above the acceptable range; discussed or provided materials on diet/exercise  3.  During the course of  the visit the patient was educated and counseled about appropriate screening and preventive services & appropriate orders entered.   Influenza vaccine.  Declines shingrex, RSV & COVID vaccines.  RTC:  Return in about 6 months (around 05/16/2024) for Routine follow-up.  This note has been created using automated tools and reviewed for accuracy by [facility] KATHRYN GAUGER.  Future Appointments     Date/Time Provider Department Center Visit Type   05/16/2024 9:30 AM Gauger, Lauraine Collar, NP [facility] OFFICE VISIT        Attestation Statement:   I personally performed the service, non-incident to. [facility])   SARAH KATHRYN GAUGER, NP  Patient seen in collaboration with Dr. Jeffie LAURAINE KATHRYN GAUGER, NP  *Some images could not be shown.

## 2024-01-31 ENCOUNTER — Other Ambulatory Visit: Payer: Self-pay

## 2024-01-31 ENCOUNTER — Emergency Department

## 2024-01-31 DIAGNOSIS — R1110 Vomiting, unspecified: Secondary | ICD-10-CM | POA: Insufficient documentation

## 2024-01-31 DIAGNOSIS — Z5321 Procedure and treatment not carried out due to patient leaving prior to being seen by health care provider: Secondary | ICD-10-CM | POA: Insufficient documentation

## 2024-01-31 DIAGNOSIS — R531 Weakness: Secondary | ICD-10-CM | POA: Insufficient documentation

## 2024-01-31 LAB — CBC
HCT: 30.6 % — ABNORMAL LOW (ref 36.0–46.0)
Hemoglobin: 9.4 g/dL — ABNORMAL LOW (ref 12.0–15.0)
MCH: 29.2 pg (ref 26.0–34.0)
MCHC: 30.7 g/dL (ref 30.0–36.0)
MCV: 95 fL (ref 80.0–100.0)
Platelets: 376 K/uL (ref 150–400)
RBC: 3.22 MIL/uL — ABNORMAL LOW (ref 3.87–5.11)
RDW: 13.9 % (ref 11.5–15.5)
WBC: 7.8 K/uL (ref 4.0–10.5)
nRBC: 0 % (ref 0.0–0.2)

## 2024-01-31 LAB — COMPREHENSIVE METABOLIC PANEL WITH GFR
ALT: 13 U/L (ref 0–44)
AST: 15 U/L (ref 15–41)
Albumin: 4.1 g/dL (ref 3.5–5.0)
Alkaline Phosphatase: 103 U/L (ref 38–126)
Anion gap: 14 (ref 5–15)
BUN: 43 mg/dL — ABNORMAL HIGH (ref 8–23)
CO2: 19 mmol/L — ABNORMAL LOW (ref 22–32)
Calcium: 9.1 mg/dL (ref 8.9–10.3)
Chloride: 102 mmol/L (ref 98–111)
Creatinine, Ser: 1.03 mg/dL — ABNORMAL HIGH (ref 0.44–1.00)
GFR, Estimated: 55 mL/min — ABNORMAL LOW
Glucose, Bld: 128 mg/dL — ABNORMAL HIGH (ref 70–99)
Potassium: 4.8 mmol/L (ref 3.5–5.1)
Sodium: 134 mmol/L — ABNORMAL LOW (ref 135–145)
Total Bilirubin: 0.3 mg/dL (ref 0.0–1.2)
Total Protein: 7 g/dL (ref 6.5–8.1)

## 2024-01-31 NOTE — ED Triage Notes (Signed)
 Pt to ED via [facility] from home. Pt reports started feeling weak this am and had a near syncopal episode while on toilet. Pt reports SOB w/ exertion. Pt reports recently sick and feels like she hasn't gotten over the weakness from that. Pt has not taken meds today. Pt also states vomiting.

## 2024-02-01 ENCOUNTER — Observation Stay
Admission: EM | Admit: 2024-02-01 | Discharge: 2024-02-04 | Disposition: A | Discharge status: Home Health | Attending: Emergency Medicine | Admitting: Emergency Medicine

## 2024-02-01 ENCOUNTER — Other Ambulatory Visit: Payer: Self-pay

## 2024-02-01 DIAGNOSIS — K250 Acute gastric ulcer with hemorrhage: Secondary | ICD-10-CM | POA: Diagnosis not present

## 2024-02-01 DIAGNOSIS — R2689 Other abnormalities of gait and mobility: Secondary | ICD-10-CM | POA: Diagnosis not present

## 2024-02-01 DIAGNOSIS — K219 Gastro-esophageal reflux disease without esophagitis: Secondary | ICD-10-CM | POA: Diagnosis not present

## 2024-02-01 DIAGNOSIS — D62 Acute posthemorrhagic anemia: Secondary | ICD-10-CM | POA: Diagnosis not present

## 2024-02-01 DIAGNOSIS — Z79899 Other long term (current) drug therapy: Secondary | ICD-10-CM | POA: Diagnosis not present

## 2024-02-01 DIAGNOSIS — K5730 Diverticulosis of large intestine without perforation or abscess without bleeding: Secondary | ICD-10-CM | POA: Diagnosis not present

## 2024-02-01 DIAGNOSIS — K921 Melena: Principal | ICD-10-CM | POA: Insufficient documentation

## 2024-02-01 DIAGNOSIS — K922 Gastrointestinal hemorrhage, unspecified: Principal | ICD-10-CM | POA: Diagnosis present

## 2024-02-01 DIAGNOSIS — K222 Esophageal obstruction: Secondary | ICD-10-CM | POA: Diagnosis not present

## 2024-02-01 DIAGNOSIS — N179 Acute kidney failure, unspecified: Secondary | ICD-10-CM | POA: Diagnosis not present

## 2024-02-01 DIAGNOSIS — I10 Essential (primary) hypertension: Secondary | ICD-10-CM

## 2024-02-01 DIAGNOSIS — D649 Anemia, unspecified: Secondary | ICD-10-CM

## 2024-02-01 DIAGNOSIS — K253 Acute gastric ulcer without hemorrhage or perforation: Secondary | ICD-10-CM

## 2024-02-01 DIAGNOSIS — Z85830 Personal history of malignant neoplasm of bone: Secondary | ICD-10-CM | POA: Insufficient documentation

## 2024-02-01 DIAGNOSIS — E785 Hyperlipidemia, unspecified: Secondary | ICD-10-CM | POA: Diagnosis not present

## 2024-02-01 DIAGNOSIS — Z8711 Personal history of peptic ulcer disease: Secondary | ICD-10-CM | POA: Diagnosis not present

## 2024-02-01 DIAGNOSIS — R2681 Unsteadiness on feet: Secondary | ICD-10-CM | POA: Diagnosis not present

## 2024-02-01 LAB — CBC
HCT: 27.6 % — ABNORMAL LOW (ref 36.0–46.0)
Hemoglobin: 8.5 g/dL — ABNORMAL LOW (ref 12.0–15.0)
MCH: 29.7 pg (ref 26.0–34.0)
MCHC: 30.8 g/dL (ref 30.0–36.0)
MCV: 96.5 fL (ref 80.0–100.0)
Platelets: 307 K/uL (ref 150–400)
RBC: 2.86 MIL/uL — ABNORMAL LOW (ref 3.87–5.11)
RDW: 14.3 % (ref 11.5–15.5)
WBC: 6 K/uL (ref 4.0–10.5)
nRBC: 0 % (ref 0.0–0.2)

## 2024-02-01 LAB — HEMOGLOBIN AND HEMATOCRIT, BLOOD
HCT: 27.4 % — ABNORMAL LOW (ref 36.0–46.0)
Hemoglobin: 8.5 g/dL — ABNORMAL LOW (ref 12.0–15.0)

## 2024-02-01 LAB — COMPREHENSIVE METABOLIC PANEL WITH GFR
ALT: 10 U/L (ref 0–44)
AST: 15 U/L (ref 15–41)
Albumin: 4.1 g/dL (ref 3.5–5.0)
Alkaline Phosphatase: 96 U/L (ref 38–126)
Anion gap: 15 (ref 5–15)
BUN: 36 mg/dL — ABNORMAL HIGH (ref 8–23)
CO2: 19 mmol/L — ABNORMAL LOW (ref 22–32)
Calcium: 9.4 mg/dL (ref 8.9–10.3)
Chloride: 104 mmol/L (ref 98–111)
Creatinine, Ser: 1.23 mg/dL — ABNORMAL HIGH (ref 0.44–1.00)
GFR, Estimated: 45 mL/min — ABNORMAL LOW
Glucose, Bld: 125 mg/dL — ABNORMAL HIGH (ref 70–99)
Potassium: 4 mmol/L (ref 3.5–5.1)
Sodium: 138 mmol/L (ref 135–145)
Total Bilirubin: 0.3 mg/dL (ref 0.0–1.2)
Total Protein: 6.8 g/dL (ref 6.5–8.1)

## 2024-02-01 LAB — URINALYSIS, ROUTINE W REFLEX MICROSCOPIC
Bilirubin Urine: NEGATIVE
Glucose, UA: NEGATIVE mg/dL
Hgb urine dipstick: NEGATIVE
Ketones, ur: NEGATIVE mg/dL
Leukocytes,Ua: NEGATIVE
Nitrite: NEGATIVE
Protein, ur: NEGATIVE mg/dL
Specific Gravity, Urine: 1.005 (ref 1.005–1.030)
pH: 5 (ref 5.0–8.0)

## 2024-02-01 LAB — MAGNESIUM: Magnesium: 1.8 mg/dL (ref 1.7–2.4)

## 2024-02-01 MED ORDER — ACETAMINOPHEN 325 MG PO TABS
650.0000 mg | ORAL_TABLET | Freq: Four times a day (QID) | ORAL | Status: DC | PRN
  Administered 2024-02-02: 650 mg via ORAL
  Filled 2024-02-01: qty 2

## 2024-02-01 MED ORDER — LACTATED RINGERS IV SOLN: INTRAVENOUS | Status: AC

## 2024-02-01 MED ORDER — PANTOPRAZOLE SODIUM 40 MG IV SOLR
40.0000 mg | Freq: Two times a day (BID) | INTRAVENOUS | Status: DC
  Administered 2024-02-02 (×3): 40 mg via INTRAVENOUS
  Filled 2024-02-01 (×3): qty 10

## 2024-02-01 MED ORDER — ONDANSETRON HCL 4 MG PO TABS: 4.0000 mg | ORAL_TABLET | Freq: Four times a day (QID) | ORAL | Status: DC | PRN

## 2024-02-01 MED ORDER — SODIUM CHLORIDE 0.9% FLUSH
3.0000 mL | Freq: Two times a day (BID) | INTRAVENOUS | Status: DC
  Administered 2024-02-01 – 2024-02-04 (×7): 3 mL via INTRAVENOUS

## 2024-02-01 MED ORDER — ACETAMINOPHEN 650 MG RE SUPP: 650.0000 mg | Freq: Four times a day (QID) | RECTAL | Status: DC | PRN

## 2024-02-01 MED ORDER — PANTOPRAZOLE SODIUM 40 MG IV SOLR
40.0000 mg | INTRAVENOUS | Status: AC
  Administered 2024-02-01 (×2): 40 mg via INTRAVENOUS
  Filled 2024-02-01: qty 10

## 2024-02-01 MED ORDER — ONDANSETRON HCL 4 MG/2ML IJ SOLN: 4.0000 mg | Freq: Four times a day (QID) | INTRAMUSCULAR | Status: DC | PRN

## 2024-02-01 MED ORDER — SENNOSIDES-DOCUSATE SODIUM 8.6-50 MG PO TABS
1.0000 | ORAL_TABLET | Freq: Every evening | ORAL | Status: DC | PRN
  Filled 2024-02-01: qty 1

## 2024-02-01 NOTE — ED Triage Notes (Signed)
 Pt reports weakness and SOB since yesterday morning. Pt worried about possible GI bleed due to the state of weakness. States she had some diarrhea today and it may have been darker. Denies blood thinners.

## 2024-02-01 NOTE — ED Provider Notes (Signed)
 "  [facility] Provider Note    Event Date/Time   First MD Initiated Contact with Patient 02/01/24 1244     (approximate)  History   Chief Complaint: Shortness of Breath and Weakness  HPI  Veronica Moon is a 79 y.o. female with a past medical history of prior GI bleeds last of which was in 2019, hypertension, hyperlipidemia, presents to the emergency department for generalized weakness.  According to the patient over the last week or so she has had progressive and generalized weakness states worse with exertion.  Patient states the symptoms are reminiscent of the time she has had GI bleeds previously.  She is also noted of the past 2 weeks or so darker appearance to her stool and the stool has been loose.  Denies any blood.  Denies any nausea or vomiting.  No abdominal pain.  No urinary symptoms.  Physical Exam   Triage Vital Signs: ED Triage Vitals  Encounter Vitals Group     BP 02/01/24 1218 (!) 136/92     Girls Systolic BP Percentile --      Girls Diastolic BP Percentile --      Boys Systolic BP Percentile --      Boys Diastolic BP Percentile --      Pulse Rate 02/01/24 1218 86     Resp 02/01/24 1218 18     Temp 02/01/24 1218 98.2 F (36.8 C)     Temp Source 02/01/24 1218 Oral     SpO2 02/01/24 1218 100 %     Weight 02/01/24 1217 224 lb 13.9 oz (102 kg)     Height 02/01/24 1217 5' 8 (1.727 m)     Head Circumference --      Peak Flow --      Pain Score 02/01/24 1217 0     Pain Loc --      Pain Education --      Exclude from Growth Chart --     Most recent vital signs: Vitals:   02/01/24 1218  BP: (!) 136/92  Pulse: 86  Resp: 18  Temp: 98.2 F (36.8 C)  SpO2: 100%    General: Awake, no distress.  CV:  Good peripheral perfusion.  Regular rate and rhythm  Resp:  Normal effort.  Equal breath sounds bilaterally.  Abd:  No distention.  Soft, nontender.  No rebound or guarding.  Benign abdomen. Other:  Rectal examination shows dark  stool strongly guaiac positive.   ED Results / Procedures / Treatments   MEDICATIONS ORDERED IN ED: Medications - No data to display   IMPRESSION / MDM / ASSESSMENT AND PLAN / ED COURSE  I reviewed the triage vital signs and the nursing notes.  Patient's presentation is most consistent with acute presentation with potential threat to life or bodily function.  Patient presents to the emergency department for generalized weakness worsening over the past 1 to 2 weeks.  Patient has had dark stool as well with a history of GI bleeds last which was in 2019.  Patient denies any anticoagulation.  No abdominal pain.  Benign abdomen on my exam.  Patient is labwork today does show a hemoglobin of 8.5 down from 9.4 yesterday and previously from 12.  Patient's chemistry shows slight renal insufficiency and mild anion gap elevation possibly consistent with mild dehydration.  Will IV hydrate.  We will type and screen for blood products to be used if needed.  With a hemoglobin of 8.5 I do not  believe the patient currently requires an emergent transfusion.  Will start the patient on Protonix  bolus and infusion and admit to the hospitalist service for further workup and treatment.  FINAL CLINICAL IMPRESSION(S) / ED DIAGNOSES   GI bleed Symptomatic anemia   Note:  This document was prepared using Dragon voice recognition software and may include unintentional dictation errors.   Dorothyann Drivers, MD 02/01/24 1305  "

## 2024-02-01 NOTE — Consult Note (Addendum)
 "      Veronica Copping, Veronica Moon [facility]  [address] [place], [place] [address] Phone: [phone] Fax : [phone]  Consultation  Referring Provider:     Dr. Fernand Primary Care Physician:  Don Lauraine Collar, NP Primary Gastroenterologist: [facility]         Reason for Consultation:     Melena  Date of Admission:  02/01/2024 Date of Consultation:  02/01/2024         HPI:   Veronica Moon is a 79 y.o. female who has a past medical history of hypertension and hyperlipidemia with a history of a GI bleed back in 2019.  The patient had presented to emergency department with weakness and reports having black stools over the last few weeks off-and-on.  The patient reports that she was taking iron  for her anemia.  She has a history of chronic kidney disease.  In addition to having dark stools she reports that the dark stools have been loose in consistency.  She denies any NSAIDs or over-the-counter medications.  There is no report of any abdominal pain associated with the melena.  The patient's blood work showed:  Component     Latest Ref Rng 03/07/2019 01/31/2024 02/01/2024  Hemoglobin     12.0 - 15.0 g/dL 87.7  9.4 (L)  8.5 (L)   HCT     36.0 - 46.0 % 39.0  30.6 (L)  27.6 (L)    The patient's last colonoscopy was in 2020.  Past Medical History:  Diagnosis Date   Blood transfusion without reported diagnosis    Cancer (HCC)    right leg 10/26/17   Colon polyps    Gastritis    GIB (gastrointestinal bleeding)    Hyperlipidemia    Hypertension     Past Surgical History:  Procedure Laterality Date   CHOLECYSTECTOMY     COLONOSCOPY     COLONOSCOPY WITH PROPOFOL  N/A 03/23/2018   Procedure: COLONOSCOPY WITH PROPOFOL ;  Surgeon: Janalyn Keene NOVAK, Veronica Moon;  Location: [facility] ENDOSCOPY;  Service: Endoscopy;  Laterality: N/A;   ESOPHAGOGASTRODUODENOSCOPY (EGD) WITH PROPOFOL  N/A 07/03/2015   Procedure: ESOPHAGOGASTRODUODENOSCOPY (EGD) WITH PROPOFOL ;  Surgeon: Veronica Copping, Veronica Moon;  Location: [facility]  ENDOSCOPY;  Service: Endoscopy;  Laterality: N/A;   ESOPHAGOGASTRODUODENOSCOPY (EGD) WITH PROPOFOL  N/A 03/23/2018   Procedure: ESOPHAGOGASTRODUODENOSCOPY (EGD) WITH PROPOFOL ;  Surgeon: Janalyn Keene NOVAK, Veronica Moon;  Location: [facility] ENDOSCOPY;  Service: Endoscopy;  Laterality: N/A;   TUBAL LIGATION      Prior to Admission medications  Medication Sig Start Date End Date Taking? Authorizing Provider  atorvastatin (LIPITOR) 20 MG tablet Take 20 mg by mouth daily.   Yes Provider, Historical, Veronica Moon  cholecalciferol  (VITAMIN D3) 25 MCG (1000 UT) tablet Take 1,000 Units by mouth daily.   Yes Provider, Historical, Veronica Moon  ferrous sulfate  325 (65 FE) MG EC tablet Take 1 tablet (325 mg total) by mouth 2 (two) times daily. Patient taking differently: Take 325 mg by mouth daily with breakfast. 12/06/17 02/01/24 Yes Sudini, Philis, Veronica Moon  hydrOXYzine (ATARAX) 25 MG tablet Take 12.5-25 mg by mouth every 8 (eight) hours as needed for anxiety. 11/16/23  Yes Provider, Historical, Veronica Moon  losartan (COZAAR) 50 MG tablet Take 50 mg by mouth daily. Take 2 tablets a day   Yes Provider, Historical, Veronica Moon  torsemide (DEMADEX) 20 MG tablet Take 20 mg by mouth daily. 02/01/23  Yes Provider, Historical, Veronica Moon  vitamin B-12 (CYANOCOBALAMIN ) 1000 MCG tablet Take 1,000 mcg by mouth daily.   Yes Provider, Historical,  Veronica Moon  amLODipine (NORVASC) 5 MG tablet Take 5 mg by mouth daily. Patient not taking: Reported on 02/01/2024    Provider, Historical, Veronica Moon  traMADol  (ULTRAM ) 50 MG tablet Take by mouth. Patient not taking: Reported on 02/01/2024 10/05/18   Provider, Historical, Veronica Moon    Family History  Problem Relation Age of Onset   Hypertension Father    Heart attack Father      Social History[1]  Allergies as of 02/01/2024 - Review Complete 02/01/2024  Allergen Reaction Noted   Amlodipine Other (See Comments) 02/01/2024   Demerol [meperidine] Itching 07/02/2015   Hydrochlorothiazide  Other (See Comments) 02/19/2023   Nsaids Other (See Comments) 02/24/2018    Codeine Itching 12/05/2017    Review of Systems:    All systems reviewed and negative except where noted in HPI.   Physical Exam:  Vital signs in last 24 hours: Temp:  [98.1 F (36.7 C)-98.2 F (36.8 C)] 98.1 F (36.7 C) (01/06 1624) Pulse Rate:  [69-100] 78 (01/06 1624) Resp:  [16-18] 16 (01/06 1624) BP: (119-158)/(46-92) 148/70 (01/06 1624) SpO2:  [95 %-100 %] 100 % (01/06 1624) Weight:  [102 kg] 102 kg (01/06 1217)   General:   Pleasant, cooperative in NAD Head:  Normocephalic and atraumatic. Eyes:   No icterus.   Conjunctiva pink. PERRLA. Ears:  Normal auditory acuity. Neck:  Supple; no masses or thyroidomegaly Lungs: Respirations even and unlabored. Lungs clear to auscultation bilaterally.   No wheezes, crackles, or rhonchi.  Heart:  Regular rate and rhythm;  Without murmur, clicks, rubs or gallops Abdomen:  Soft, nondistended, nontender. Normal bowel sounds. No appreciable masses or hepatomegaly.  No rebound or guarding.  Rectal:  Not performed. Msk:  Symmetrical without gross deformities.    Extremities:  Without edema, cyanosis or clubbing. Neurologic:  Alert and oriented x3;  grossly normal neurologically. Skin:  Intact without significant lesions or rashes. Cervical Nodes:  No significant cervical adenopathy. Psych:  Alert and cooperative. Normal affect.  LAB RESULTS: Recent Labs    01/31/24 1641 02/01/24 1221  WBC 7.8 6.0  HGB 9.4* 8.5*  HCT 30.6* 27.6*  PLT 376 307   BMET Recent Labs    01/31/24 1641 02/01/24 1221  NA 134* 138  K 4.8 4.0  CL 102 104  CO2 19* 19*  GLUCOSE 128* 125*  BUN 43* 36*  CREATININE 1.03* 1.23*  CALCIUM 9.1 9.4   LFT Recent Labs    02/01/24 1221  PROT 6.8  ALBUMIN 4.1  AST 15  ALT 10  ALKPHOS 96  BILITOT 0.3   PT/INR No results for input(s): LABPROT, INR in the last 72 hours.  STUDIES: DG Chest 2 View Result Date: 01/31/2024 EXAM: 2 VIEW(S) XRAY OF THE CHEST 01/31/2024 05:02:00 PM COMPARISON: Chest  radiograph 09/01/2016 and CT chest 09/01/2016. CLINICAL HISTORY: sob FINDINGS: LUNGS AND PLEURA: Shallow inspiration. Focal scarring or atelectasis in the left costophrenic angle. No airspace disease or consolidation in the lungs. No pleural effusion. No pneumothorax. HEART AND MEDIASTINUM: Heart size and pulmonary vascularity are normal. Large hiatal hernia behind the heart. Calcification of the aorta. BONES AND SOFT TISSUES: Degenerative changes in the spine. IMPRESSION: 1. No acute cardiopulmonary abnormality. 2. Large hiatal hernia behind the heart. Electronically signed by: Elsie Gravely Veronica Moon 01/31/2024 05:50 PM EST RP Workstation: [id number]      Impression / Plan:   Assessment: Principal Problem:   GI bleeding Active Problems:   Essential hypertension   Hyperlipidemia   GERD (gastroesophageal reflux disease)  Veronica Moon is a 79 y.o. y/o female with this patient comes in with symptomatic anemia with melena and a history of peptic ulcer disease.  The patient reports that she had significant heartburn for many years with regurgitation but states that after the last EGD the symptoms went away unexplainably.  She reports that she had 1 black stool today and 2 yesterday.  Plan:  The patient will be put on a regular diet for today and then n.p.o. after midnight for a EGD for tomorrow.  The patient has been explained the procedure including risk benefits and alternatives and agrees to proceeding with the upper endoscopy.  PPI IV twice daily  Continue serial CBCs and transfuse PRN Avoid NSAIDs Maintain 2 large-bore IV lines Please page GI with any acute hemodynamic changes, or signs of active GI bleeding   Thank you for involving me in the care of this patient.      LOS: 0 days   Veronica Copping, MD, Veronica Moon. NOLIA 02/01/2024, 5:06 PM,  Pager [phone] 7am-5pm  Check AMION for 5pm -7am coverage and on weekends   Note: This dictation was prepared with Dragon dictation along with  smaller phrase technology. Any transcriptional errors that result from this process are unintentional.       [1]  Social History Tobacco Use   Smoking status: Never   Smokeless tobacco: Never  Vaping Use   Vaping status: Never Used  Substance Use Topics   Alcohol use: No   Drug use: No   "

## 2024-02-01 NOTE — H&P (Signed)
 " History and Physical    Veronica Moon FMW:[id number] DOB: 10-Dec-1945 DOA: 02/01/2024  DOS: the patient was seen and examined on 02/01/2024  PCP: Don Lauraine Collar, NP   Patient coming from: Home  I have personally briefly reviewed patient's old medical records in [facility] and CareEverywhere  HPI:   Veronica Moon is a 79 y.o. year old female with medical history of hypertension, hyperlipidemia, CKD 3a., GERD, PUD and history of GI bleeding presenting to the ED with generalized weakness and report of dark stools. States she can tell the difference from her stool color when she is only taking iron . No NSAID, goody's power use. Her weakness has been progressive over the last 2 weeks.  States has loose stools over the last 1-2 weeks. She presented to the ED yesterday but left given the long wait time. On arrival to the ED patient was noted to be HDS stable.  Lab work showed CBC with hemoglobin at 8.5, yesterday was 9.4.  In 08/2023 hemoglobin was 12.6 and baseline appears to be around.  CMP showed elevated creatinine consistent with CKD 3a.  Chest x-ray obtained yesterday did not show any acute findings.  Given her weakness and drop in hemoglobin, [facility] contacted for admission.  Review of Systems: As mentioned in the history of present illness. All other systems reviewed and are negative.   Past Medical History:  Diagnosis Date   Blood transfusion without reported diagnosis    Cancer (HCC)    right leg 10/26/17   Colon polyps    Gastritis    GIB (gastrointestinal bleeding)    Hyperlipidemia    Hypertension     Past Surgical History:  Procedure Laterality Date   CHOLECYSTECTOMY     COLONOSCOPY     COLONOSCOPY WITH PROPOFOL  N/A 03/23/2018   Procedure: COLONOSCOPY WITH PROPOFOL ;  Surgeon: Janalyn Keene NOVAK, MD;  Location: [facility] ENDOSCOPY;  Service: Endoscopy;  Laterality: N/A;   ESOPHAGOGASTRODUODENOSCOPY (EGD) WITH PROPOFOL  N/A 07/03/2015   Procedure:  ESOPHAGOGASTRODUODENOSCOPY (EGD) WITH PROPOFOL ;  Surgeon: Rogelia Copping, MD;  Location: [facility] ENDOSCOPY;  Service: Endoscopy;  Laterality: N/A;   ESOPHAGOGASTRODUODENOSCOPY (EGD) WITH PROPOFOL  N/A 03/23/2018   Procedure: ESOPHAGOGASTRODUODENOSCOPY (EGD) WITH PROPOFOL ;  Surgeon: Janalyn Keene NOVAK, MD;  Location: [facility] ENDOSCOPY;  Service: Endoscopy;  Laterality: N/A;   TUBAL LIGATION       Allergies[1]  Family History  Problem Relation Age of Onset   Hypertension Father    Heart attack Father     Prior to Admission medications  Medication Sig Start Date End Date Taking? Authorizing Provider  atorvastatin (LIPITOR) 20 MG tablet Take 20 mg by mouth daily.    [provider]  cholecalciferol  (VITAMIN D3) 25 MCG (1000 UT) tablet Take 1,000 Units by mouth daily.    [provider]  ferrous sulfate  325 (65 FE) MG EC tablet Take 1 tablet (325 mg total) by mouth 2 (two) times daily. 12/06/17 03/07/19  Yisroel Sleight, MD  hydrochlorothiazide  (HYDRODIURIL ) 25 MG tablet Take 1 tablet (25 mg total) by mouth daily. 12/06/17   Sudini, Srikar, MD  losartan (COZAAR) 50 MG tablet Take 50 mg by mouth daily. Take 2 tablets a day    [provider]  traMADol  (ULTRAM ) 50 MG tablet Take by mouth. 10/05/18   [provider]  vitamin B-12 (CYANOCOBALAMIN ) 1000 MCG tablet Take 1,000 mcg by mouth daily.    [provider]    Social History:  reports that she has never smoked. She  has never used smokeless tobacco. She reports that she does not drink alcohol and does not use drugs.    Physical Exam: Vitals:   02/01/24 1315 02/01/24 1320 02/01/24 1325 02/01/24 1330  BP:    136/63  Pulse: 71 73 74 69  Resp:      Temp:      TempSrc:      SpO2: 100% 100% 100% 100%  Weight:      Height:        Gen: NAD HENT: NCAT CV: normal heart sounds Lung: CTAB Abd: No TTP, normal bowel sounds MSK: No asymmetry, good bulk and tone Neuro: alert and oriented   Labs on  Admission: I have personally reviewed following labs and imaging studies  CBC: Recent Labs  Lab 01/31/24 1641 02/01/24 1221  WBC 7.8 6.0  HGB 9.4* 8.5*  HCT 30.6* 27.6*  MCV 95.0 96.5  PLT 376 307   Basic Metabolic Panel: Recent Labs  Lab 01/31/24 1641 02/01/24 1221  NA 134* 138  K 4.8 4.0  CL 102 104  CO2 19* 19*  GLUCOSE 128* 125*  BUN 43* 36*  CREATININE 1.03* 1.23*  CALCIUM 9.1 9.4   GFR: Estimated Creatinine Clearance: 47.1 mL/min (A) (by C-G formula based on SCr of 1.23 mg/dL (H)). Liver Function Tests: Recent Labs  Lab 01/31/24 1641 02/01/24 1221  AST 15 15  ALT 13 10  ALKPHOS 103 96  BILITOT 0.3 0.3  PROT 7.0 6.8  ALBUMIN 4.1 4.1   No results for input(s): LIPASE, AMYLASE in the last 168 hours. No results for input(s): AMMONIA in the last 168 hours. Coagulation Profile: No results for input(s): INR, PROTIME in the last 168 hours. Cardiac Enzymes: No results for input(s): CKTOTAL, CKMB, CKMBINDEX, TROPONINI, TROPONINIHS in the last 168 hours. BNP (last 3 results) No results for input(s): BNP in the last 8760 hours. HbA1C: No results for input(s): HGBA1C in the last 72 hours. CBG: No results for input(s): GLUCAP in the last 168 hours. Lipid Profile: No results for input(s): CHOL, HDL, LDLCALC, TRIG, CHOLHDL, LDLDIRECT in the last 72 hours. Thyroid  Function Tests: No results for input(s): TSH, T4TOTAL, FREET4, T3FREE, THYROIDAB in the last 72 hours. Anemia Panel: No results for input(s): VITAMINB12, FOLATE, FERRITIN, TIBC, IRON , RETICCTPCT in the last 72 hours. Urine analysis:    Component Value Date/Time   COLORURINE Yellow 04/22/2014 2210   APPEARANCEUR Cloudy 04/22/2014 2210   LABSPEC 1.020 04/22/2014 2210   PHURINE 5.0 04/22/2014 2210   GLUCOSEU Negative 04/22/2014 2210   HGBUR 2+ 04/22/2014 2210   BILIRUBINUR Negative 04/22/2014 2210   KETONESUR Negative 04/22/2014 2210    PROTEINUR Negative 04/22/2014 2210   NITRITE Negative 04/22/2014 2210   LEUKOCYTESUR Trace 04/22/2014 2210    Radiological Exams on Admission: I have personally reviewed images DG Chest 2 View Result Date: 01/31/2024 EXAM: 2 VIEW(S) XRAY OF THE CHEST 01/31/2024 05:02:00 PM COMPARISON: Chest radiograph 09/01/2016 and CT chest 09/01/2016. CLINICAL HISTORY: sob FINDINGS: LUNGS AND PLEURA: Shallow inspiration. Focal scarring or atelectasis in the left costophrenic angle. No airspace disease or consolidation in the lungs. No pleural effusion. No pneumothorax. HEART AND MEDIASTINUM: Heart size and pulmonary vascularity are normal. Large hiatal hernia behind the heart. Calcification of the aorta. BONES AND SOFT TISSUES: Degenerative changes in the spine. IMPRESSION: 1. No acute cardiopulmonary abnormality. 2. Large hiatal hernia behind the heart. Electronically signed by: Elsie Gravely MD 01/31/2024 05:50 PM EST RP Workstation: [id number]    EKG: My personal interpretation  of EKG shows: Normal sinus rhythm without any acute ST changes    Assessment/Plan Principal Problem:   GI bleeding Active Problems:   Essential hypertension   Hyperlipidemia   GERD (gastroesophageal reflux disease)   GI bleed Pt with declining hgb but HDS with no complaints of weakness suspect GIB. Pt with hx of GIB.  This appears to be subacute in place. Last colonoscopy was in 2020, last endoscopy was in 2020.  -GI consult, appreciate their assistance -Maintain adequate acess with 2 large bore PIV -IV PPI 40 mg BID-Keep NPO -H/H q8hrs, transfuse if  hgb <7, and stat hemoglobin with any hemodynamic instability. - Repeat iron  studies  Hypertension: Holding home antihypertensives..  Hyperlipidemia: Continue home statin  Normocytic anemia: Per PCP notes, her anemia secondary to her repeated episodes of GI bleed.  She is on chronic iron  replacement therapy.   VTE prophylaxis:  SCDs  Diet: N.p.o. Code Status:  Full  Code Telemetry:  Admission status: Observation, Telemetry bed Patient is from: Home Anticipated d/c is to: Home Anticipated d/c is in: 1-2 days   Family Communication: Updated at bedside  Consults called: Gastroenterology   Severity of Illness: The appropriate patient status for this patient is OBSERVATION. Observation status is judged to be reasonable and necessary in order to provide the required intensity of service to ensure the patient's safety. The patient's presenting symptoms, physical exam findings, and initial radiographic and laboratory data in the context of their medical condition is felt to place them at decreased risk for further clinical deterioration. Furthermore, it is anticipated that the patient will be medically stable for discharge from the hospital within 2 midnights of admission.    Morene Bathe, MD Jolynn DEL. [facility]     [1]  Allergies Allergen Reactions   Demerol [Meperidine] Itching   Codeine Itching   "

## 2024-02-02 ENCOUNTER — Encounter
Admission: EM | Disposition: A | Discharge status: Home Health | Payer: Self-pay | Source: Home / Self Care | Attending: Emergency Medicine

## 2024-02-02 ENCOUNTER — Observation Stay

## 2024-02-02 ENCOUNTER — Encounter: Payer: Self-pay | Admitting: Internal Medicine

## 2024-02-02 DIAGNOSIS — K449 Diaphragmatic hernia without obstruction or gangrene: Secondary | ICD-10-CM | POA: Diagnosis not present

## 2024-02-02 DIAGNOSIS — K259 Gastric ulcer, unspecified as acute or chronic, without hemorrhage or perforation: Secondary | ICD-10-CM | POA: Diagnosis not present

## 2024-02-02 DIAGNOSIS — K253 Acute gastric ulcer without hemorrhage or perforation: Secondary | ICD-10-CM

## 2024-02-02 DIAGNOSIS — K222 Esophageal obstruction: Secondary | ICD-10-CM | POA: Diagnosis not present

## 2024-02-02 DIAGNOSIS — K922 Gastrointestinal hemorrhage, unspecified: Secondary | ICD-10-CM | POA: Diagnosis not present

## 2024-02-02 HISTORY — PX: ESOPHAGOGASTRODUODENOSCOPY: SHX5428

## 2024-02-02 LAB — HEMOGLOBIN AND HEMATOCRIT, BLOOD
HCT: 23.9 % — ABNORMAL LOW (ref 36.0–46.0)
HCT: 24.1 % — ABNORMAL LOW (ref 36.0–46.0)
HCT: 23.5 % — ABNORMAL LOW (ref 36.0–46.0)
Hemoglobin: 7.2 g/dL — ABNORMAL LOW (ref 12.0–15.0)
Hemoglobin: 7.5 g/dL — ABNORMAL LOW (ref 12.0–15.0)
Hemoglobin: 7.5 g/dL — ABNORMAL LOW (ref 12.0–15.0)

## 2024-02-02 LAB — BASIC METABOLIC PANEL WITH GFR
Anion gap: 12 (ref 5–15)
BUN: 35 mg/dL — ABNORMAL HIGH (ref 8–23)
CO2: 22 mmol/L (ref 22–32)
Calcium: 9.2 mg/dL (ref 8.9–10.3)
Chloride: 105 mmol/L (ref 98–111)
Creatinine, Ser: 1.47 mg/dL — ABNORMAL HIGH (ref 0.44–1.00)
GFR, Estimated: 36 mL/min — ABNORMAL LOW
Glucose, Bld: 95 mg/dL (ref 70–99)
Potassium: 4.1 mmol/L (ref 3.5–5.1)
Sodium: 139 mmol/L (ref 135–145)

## 2024-02-02 LAB — GLUCOSE, CAPILLARY: Glucose-Capillary: 98 mg/dL (ref 70–99)

## 2024-02-02 LAB — CBC
HCT: 24.4 % — ABNORMAL LOW (ref 36.0–46.0)
Hemoglobin: 7.4 g/dL — ABNORMAL LOW (ref 12.0–15.0)
MCH: 29.2 pg (ref 26.0–34.0)
MCHC: 30.3 g/dL (ref 30.0–36.0)
MCV: 96.4 fL (ref 80.0–100.0)
Platelets: 256 K/uL (ref 150–400)
RBC: 2.53 MIL/uL — ABNORMAL LOW (ref 3.87–5.11)
RDW: 14.4 % (ref 11.5–15.5)
WBC: 6 K/uL (ref 4.0–10.5)
nRBC: 0 % (ref 0.0–0.2)

## 2024-02-02 MED ORDER — PROPOFOL 500 MG/50ML IV EMUL
INTRAVENOUS | Status: DC | PRN
  Administered 2024-02-02: 150 ug/kg/min via INTRAVENOUS
  Administered 2024-02-02: 100 mg via INTRAVENOUS

## 2024-02-02 MED ORDER — LIDOCAINE HCL (PF) 2 % IJ SOLN
INTRAMUSCULAR | Status: DC | PRN
  Administered 2024-02-02: 100 mg via INTRADERMAL

## 2024-02-02 MED ORDER — IRON SUCROSE 300 MG IVPB - SIMPLE MED
300.0000 mg | Freq: Once | Status: AC
  Administered 2024-02-02: 300 mg via INTRAVENOUS
  Filled 2024-02-02: qty 300

## 2024-02-02 MED ORDER — SODIUM CHLORIDE 0.9 % IV SOLN: INTRAVENOUS | Status: DC

## 2024-02-02 MED ORDER — DEXMEDETOMIDINE HCL IN NACL 80 MCG/20ML IV SOLN
INTRAVENOUS | Status: DC | PRN
  Administered 2024-02-02: 12 ug via INTRAVENOUS

## 2024-02-02 MED ORDER — PHENYLEPHRINE 80 MCG/ML (10ML) SYRINGE FOR IV PUSH (FOR BLOOD PRESSURE SUPPORT)
PREFILLED_SYRINGE | INTRAVENOUS | Status: AC
  Filled 2024-02-02: qty 10

## 2024-02-02 MED ORDER — DEXMEDETOMIDINE HCL IN NACL 80 MCG/20ML IV SOLN
INTRAVENOUS | Status: AC
  Filled 2024-02-02: qty 20

## 2024-02-02 MED ORDER — PHENYLEPHRINE 80 MCG/ML (10ML) SYRINGE FOR IV PUSH (FOR BLOOD PRESSURE SUPPORT)
PREFILLED_SYRINGE | INTRAVENOUS | Status: DC | PRN
  Administered 2024-02-02 (×2): 160 ug via INTRAVENOUS

## 2024-02-02 NOTE — Evaluation (Signed)
 Physical Therapy Evaluation Patient Details Name: Veronica Moon MRN: [id number] DOB: Dec 27, 1945 Today's Date: 02/02/2024  History of Present Illness  78yoF comes to [facility] ED on 01/31/24 fealing weak and presyncopal on toilet, new DOE. (+) recent illness, (+) recent emesis. PMH: HTN, HLD, CKD3, GIB. GI consulted regarding melena.  Clinical Impression  Pt endorses limited activity tolerance today, just 89ft, unsure how much farther she could AMB, baseline tolerance of ad lib community AMB with device. Pt has mild DOE still, correlated with down trending Hb. Pt hopes to return to home at DC with support from 2-3 DTR. HHPT would be instrumental in a quick and safer return to baseline post DC. Will plan to see again post EGD once PO intake resumes to assess longer distance tolerance and safe stairs performance.       If plan is discharge home, recommend the following: Assist for transportation;Assistance with cooking/housework;Help with stairs or ramp for entrance   Can travel by private vehicle        Equipment Recommendations None recommended by PT  Recommendations for Other Services       Functional Status Assessment Patient has had a recent decline in their functional status and demonstrates the ability to make significant improvements in function in a reasonable and predictable amount of time.     Precautions / Restrictions Precautions Precautions: Fall Restrictions Weight Bearing Restrictions Per Provider Order: No      Mobility  Bed Mobility Overal bed mobility: Independent                  Transfers Overall transfer level: Independent                      Ambulation/Gait   Gait Distance (Feet): 50 Feet Assistive device: IV Pole Gait Pattern/deviations: WFL(Within Functional Limits)       General Gait Details: feels weak still, mild DOE remains (Hb: 7s this AM)  Stairs            Wheelchair Mobility     Tilt Bed    Modified Rankin  (Stroke Patients Only)       Balance Overall balance assessment:  (baseline falls anxiety)                                           Pertinent Vitals/Pain Pain Assessment Pain Assessment: No/denies pain    Home Living Family/patient expects to be discharged to:: Private residence Living Arrangements: Alone Available Help at Discharge: Family Type of Home: House Home Access: Stairs to enter Entrance Stairs-Rails: Right Entrance Stairs-Number of Steps: 6   Home Layout: One level Home Equipment: [employer] (2 wheels);Grab bars - toilet Additional Comments: (P) RW typically for overnight mobility;    Prior Function Prior Level of Function : Independent/Modified Independent;Driving             Mobility Comments: mostly household AMB without device, RW for overnight mobility to/from BR; reluctant to access community without a shopping cart available. ADLs Comments: independent     Extremity/Trunk Assessment                Communication        Cognition Arousal: Alert Behavior During Therapy: WFL for tasks assessed/performed   PT - Cognitive impairments: No apparent impairments  Cueing       General Comments      Exercises     Assessment/Plan    PT Assessment Patient needs continued PT services  PT Problem List Decreased mobility;Decreased activity tolerance;Decreased balance       PT Treatment Interventions DME instruction;Gait training;Stair training;Functional mobility training;Therapeutic activities;Therapeutic exercise;Balance training;Neuromuscular re-education;Patient/family education    PT Goals (Current goals can be found in the Care Plan section)  Acute Rehab PT Goals Patient Stated Goal: return to home with help from family. PT Goal Formulation: With patient Time For Goal Achievement: 02/16/24 Potential to Achieve Goals: Good    Frequency Min 2X/week      Co-evaluation               AM-PAC PT 6 Clicks Mobility  Outcome Measure Help needed turning from your back to your side while in a flat bed without using bedrails?: None Help needed moving from lying on your back to sitting on the side of a flat bed without using bedrails?: None Help needed moving to and from a bed to a chair (including a wheelchair)?: A Little Help needed standing up from a chair using your arms (e.g., wheelchair or bedside chair)?: A Little Help needed to walk in hospital room?: A Little Help needed climbing 3-5 steps with a railing? : A Little 6 Click Score: 20    End of Session   Activity Tolerance: Patient tolerated treatment well;Patient limited by fatigue Patient left: in bed;with family/visitor present;with call bell/phone within reach   PT Visit Diagnosis: Other abnormalities of gait and mobility (R26.89);Unsteadiness on feet (R26.81);Difficulty in walking, not elsewhere classified (R26.2)    Time: 9142-9089 PT Time Calculation (min) (ACUTE ONLY): 13 min   Charges:   PT Evaluation $PT Eval Low Complexity: 1 Low   PT General Charges $$ ACUTE PT VISIT: 1 Visit    9:16 AM, 02/02/2024 Veronica Moon, PT, DPT Physical Therapist - [facility]  [phone] (ASCOM)    Veronica Moon 02/02/2024, 9:14 AM

## 2024-02-02 NOTE — Op Note (Signed)
 [facility] Gastroenterology Patient Name: Veronica Moon Procedure Date: 02/02/2024 11:46 AM MRN: [id number] Account #: [id number] Date of Birth: 1945/07/29 Admit Type: Inpatient Age: 79 Room: [facility] ENDO ROOM 4 Gender: Female Note Status: Finalized Instrument Name: Endoscope [id number] Procedure:             Upper GI endoscopy Indications:           Melena Providers:             Rogelia Copping MD, MD Referring MD:          Lauraine LOIS Leak (Referring MD) Medicines:             Propofol  per Anesthesia Complications:         No immediate complications. Procedure:             Pre-Anesthesia Assessment:                        - Prior to the procedure, a History and Physical was                         performed, and patient medications and allergies were                         reviewed. The patient's tolerance of previous                         anesthesia was also reviewed. The risks and benefits                         of the procedure and the sedation options and risks                         were discussed with the patient. All questions were                         answered, and informed consent was obtained. Prior                         Anticoagulants: The patient has taken no anticoagulant                         or antiplatelet agents. ASA Grade Assessment: II - A                         patient with mild systemic disease. After reviewing                         the risks and benefits, the patient was deemed in                         satisfactory condition to undergo the procedure.                        After obtaining informed consent, the endoscope was                         passed under direct vision. Throughout the procedure,  the patient's blood pressure, pulse, and oxygen                         saturations were monitored continuously. The Endoscope                         was introduced through the mouth, and advanced to the                          second part of duodenum. The upper GI endoscopy was                         accomplished without difficulty. The patient tolerated                         the procedure well. Findings:      A medium-sized hiatal hernia was present.      One benign-appearing, intrinsic moderate stenosis was found at the       gastroesophageal junction. The stenosis was traversed.      Few non-bleeding linear gastric ulcers with no stigmata of bleeding were       found in the gastric body.      The examined duodenum was normal. Impression:            - Medium-sized hiatal hernia.                        - Benign-appearing esophageal stenosis.                        - Non-bleeding gastric ulcers with no stigmata of                         bleeding.                        - Normal examined duodenum.                        - No specimens collected. Recommendation:        - Return patient to hospital ward for ongoing care.                        - Resume previous diet.                        - Continue present medications.                        - Use a proton pump inhibitor PO daily.                        - No ibuprofen, naproxen, or other non-steroidal                         anti-inflammatory drugs. Procedure Code(s):     --- Professional ---                        [phone], Esophagogastroduodenoscopy, flexible,  transoral; diagnostic, including collection of                         specimen(s) by brushing or washing, when performed                         (separate procedure) Diagnosis Code(s):     --- Professional ---                        K92.1, Melena (includes Hematochezia)                        K25.9, Gastric ulcer, unspecified as acute or chronic,                         without hemorrhage or perforation CPT copyright 2022 American Medical Association. All rights reserved. The codes documented in this report are preliminary and upon coder review may  be  revised to meet current compliance requirements. Rogelia Copping MD, MD 02/02/2024 12:53:38 PM This report has been signed electronically. Number of Addenda: 0 Note Initiated On: 02/02/2024 11:46 AM Estimated Blood Loss:  Estimated blood loss: none.      [facility]

## 2024-02-02 NOTE — Progress Notes (Signed)
 The patient an upper endoscopy with a linear ulceration seen in the stomach without any old blood or new blood seen.  The patient's hemoglobin has been stable overnight although down from yesterday.  This may be due to a calibration.  There does not appear to be any further sign of GI bleeding.  The patient should be treated with iron  and monitor her hemoglobin after discharge and consider repeat colonoscopy and possible capsule endoscopy if the anemia persists.

## 2024-02-02 NOTE — Transfer of Care (Signed)
 Immediate Anesthesia Transfer of Care Note  Patient: Veronica Moon  Procedure(s) Performed: EGD (ESOPHAGOGASTRODUODENOSCOPY)  Patient Location: Endoscopy Unit  Anesthesia Type:General  Level of Consciousness: awake  Airway & Oxygen Therapy: Patient Spontanous Breathing  Post-op Assessment: Report given to RN and Post -op Vital signs reviewed and stable  Post vital signs: Reviewed and stable  Last Vitals:  Vitals Value Taken Time  BP 97/39 02/02/24 12:59  Temp    Pulse 59 02/02/24 13:00  Resp 18 02/02/24 13:00  SpO2 98 % 02/02/24 13:00  Vitals shown include unfiled device data.  Last Pain:  Vitals:   02/02/24 1212  TempSrc: Temporal  PainSc: 0-No pain         Complications: No notable events documented.

## 2024-02-02 NOTE — Care Management Obs Status (Signed)
 MEDICARE OBSERVATION STATUS NOTIFICATION   Patient Details  Name: Veronica Moon MRN: [id number] Date of Birth: [birth year]   Medicare Observation Status Notification Given:       Taniela Feltus W, CMA 02/02/2024, 2:34 PM

## 2024-02-02 NOTE — Progress Notes (Signed)
 " PROGRESS NOTE    Veronica Moon  FMW:[id number] DOB: October 22, 1945 DOA: 02/01/2024 PCP: Don Lauraine Collar, NP    Brief Narrative:  79 y.o. year old female with medical history of hypertension, hyperlipidemia, CKD 3a., GERD, PUD and history of GI bleeding presenting to the ED with generalized weakness and report of dark stools. States she can tell the difference from her stool color when she is only taking iron . No NSAID, goody's power use. Her weakness has been progressive over the last 2 weeks.  States has loose stools over the last 1-2 weeks. She presented to the ED yesterday but left given the long wait time. On arrival to the ED patient was noted to be HDS stable.  Lab work showed CBC with hemoglobin at 8.5, yesterday was 9.4.  In 08/2023 hemoglobin was 12.6 and baseline appears to be around.  CMP showed elevated creatinine consistent with CKD 3a.  Chest x-ray obtained yesterday did not show any acute findings.  Given her weakness and drop in hemoglobin, [facility] contacted for admission.    Assessment & Plan:   Principal Problem:   GI bleeding Active Problems:   Essential hypertension   Hyperlipidemia   GERD (gastroesophageal reflux disease)   Symptomatic anemia   Acute gastric ulcer  GI bleed Pt with declining hgb but HDS with no complaints of weakness suspect GIB. Pt with hx of GIB.  This appears to be subacute in place. Last colonoscopy was in 2020, last endoscopy was in 2020.  Plan: EGD today IV PPI twice daily IV iron  GI following  AKI Unknown etiology.  Creatinine has deteriorated during course of admission. Primary differential consideration is prerenal azotemia but unable to exclude other causes Nonoliguric renal failure Plan: Increase rate of IV fluids Defer renal ultrasound at this time as patient is making urine Monitor renal function  Hypertension:  Holding home antihypertensives..   Hyperlipidemia:  Continue home statin   Normocytic anemia:  Per PCP  notes, her anemia secondary to her repeated episodes of GI bleed.  She is on chronic iron  replacement therapy.  Will administer IV iron  while hospitalized   DVT prophylaxis: SCD Code Status: Full Family Communication: Family members at bedside 1/7 Disposition Plan: Status is: Observation The patient will require care spanning > 2 midnights and should be moved to inpatient because: AKI, acute symptomatic anemia secondary to GI bleed.  Anticipate discharge 1/8   Level of care: Telemetry  Consultants:  GI  Procedures:  EGD  Antimicrobials: None   Subjective: Seen and examined.  Resting comfortably in bed.  Only complaint is fatigue.  No pain complaints.  Objective: Vitals:   02/02/24 1300 02/02/24 1303 02/02/24 1310 02/02/24 1320  BP:  (!) 133/53 (!) 105/43 (!) 113/43  Pulse: 65 67 65 67  Resp: 19 (!) 21 18 13   Temp:      TempSrc:      SpO2: 98% 97% 98% 98%  Weight:      Height:        Intake/Output Summary (Last 24 hours) at 02/02/2024 1329 Last data filed at 02/02/2024 1252 Gross per 24 hour  Intake 779.4 ml  Output --  Net 779.4 ml   Filed Weights   02/01/24 1217 02/02/24 0500  Weight: 102 kg 104.8 kg    Examination:  General exam: Appears calm and comfortable  Respiratory system: Clear to auscultation. Respiratory effort normal. Cardiovascular system: S1-S2, RRR, no murmurs, no pedal edema Gastrointestinal system: Soft, NT/ND, normal bowel sounds Central nervous system:  Alert and oriented. No focal neurological deficits. Extremities: Symmetric 5 x 5 power. Skin: Pale with no obvious rashes or lesions Psychiatry: Judgement and insight appear normal. Mood & affect appropriate.     Data Reviewed: I have personally reviewed following labs and imaging studies  CBC: Recent Labs  Lab 01/31/24 1641 02/01/24 1221 02/01/24 1732 02/02/24 0226 02/02/24 0541 02/02/24 1037  WBC 7.8 6.0  --   --  6.0  --   HGB 9.4* 8.5* 8.5* 7.2* 7.4* 7.5*  HCT 30.6* 27.6*  27.4* 23.9* 24.4* 24.1*  MCV 95.0 96.5  --   --  96.4  --   PLT 376 307  --   --  256  --    Basic Metabolic Panel: Recent Labs  Lab 01/31/24 1641 02/01/24 1221 02/02/24 0541  NA 134* 138 139  K 4.8 4.0 4.1  CL 102 104 105  CO2 19* 19* 22  GLUCOSE 128* 125* 95  BUN 43* 36* 35*  CREATININE 1.03* 1.23* 1.47*  CALCIUM 9.1 9.4 9.2  MG  --  1.8  --    GFR: Estimated Creatinine Clearance: 40 mL/min (A) (by C-G formula based on SCr of 1.47 mg/dL (H)). Liver Function Tests: Recent Labs  Lab 01/31/24 1641 02/01/24 1221  AST 15 15  ALT 13 10  ALKPHOS 103 96  BILITOT 0.3 0.3  PROT 7.0 6.8  ALBUMIN 4.1 4.1   No results for input(s): LIPASE, AMYLASE in the last 168 hours. No results for input(s): AMMONIA in the last 168 hours. Coagulation Profile: No results for input(s): INR, PROTIME in the last 168 hours. Cardiac Enzymes: No results for input(s): CKTOTAL, CKMB, CKMBINDEX, TROPONINI in the last 168 hours. BNP (last 3 results) No results for input(s): PROBNP in the last 8760 hours. HbA1C: No results for input(s): HGBA1C in the last 72 hours. CBG: Recent Labs  Lab 02/02/24 0807  GLUCAP 98   Lipid Profile: No results for input(s): CHOL, HDL, LDLCALC, TRIG, CHOLHDL, LDLDIRECT in the last 72 hours. Thyroid  Function Tests: No results for input(s): TSH, T4TOTAL, FREET4, T3FREE, THYROIDAB in the last 72 hours. Anemia Panel: No results for input(s): VITAMINB12, FOLATE, FERRITIN, TIBC, IRON , RETICCTPCT in the last 72 hours. Sepsis Labs: No results for input(s): PROCALCITON, LATICACIDVEN in the last 168 hours.  No results found for this or any previous visit (from the past 240 hours).       Radiology Studies: DG Chest 2 View Result Date: 01/31/2024 EXAM: 2 VIEW(S) XRAY OF THE CHEST 01/31/2024 05:02:00 PM COMPARISON: Chest radiograph 09/01/2016 and CT chest 09/01/2016. CLINICAL HISTORY: sob FINDINGS: LUNGS AND  PLEURA: Shallow inspiration. Focal scarring or atelectasis in the left costophrenic angle. No airspace disease or consolidation in the lungs. No pleural effusion. No pneumothorax. HEART AND MEDIASTINUM: Heart size and pulmonary vascularity are normal. Large hiatal hernia behind the heart. Calcification of the aorta. BONES AND SOFT TISSUES: Degenerative changes in the spine. IMPRESSION: 1. No acute cardiopulmonary abnormality. 2. Large hiatal hernia behind the heart. Electronically signed by: Elsie Gravely MD 01/31/2024 05:50 PM EST RP Workstation: [id number]        Scheduled Meds:  [MAR Hold] pantoprazole  (PROTONIX ) IV  40 mg Intravenous Q12H   [MAR Hold] sodium chloride  flush  3 mL Intravenous Q12H   Continuous Infusions:  sodium chloride  Stopped (02/02/24 1252)   iron  sucrose     lactated ringers  100 mL/hr at 02/02/24 0914     LOS: 0 days    Calvin KATHEE Robson, MD [facility]   If 7PM-7AM, please contact night-coverage  02/02/2024, 1:29 PM   "

## 2024-02-02 NOTE — Care Management Obs Status (Signed)
 MEDICARE OBSERVATION STATUS NOTIFICATION   Patient Details  Name: Veronica Moon MRN: [id number] Date of Birth: [birth year]   Medicare Observation Status Notification Given:  Chaney BRANDY CHRISTIANE LELON, CMA 02/02/2024, 4:35 PM

## 2024-02-02 NOTE — Anesthesia Preprocedure Evaluation (Signed)
"                                    Anesthesia Evaluation  Patient identified by MRN, date of birth, ID band Patient awake    Reviewed: Allergy & Precautions, H&P , NPO status , Patient's Chart, lab work & pertinent test results  Airway Mallampati: III     Mouth opening: Limited Mouth Opening Comment: Somewhat small mouth opening Dental  (+) Upper Dentures, Missing   Pulmonary neg pulmonary ROS          Cardiovascular hypertension,      Neuro/Psych negative neurological ROS  negative psych ROS   GI/Hepatic Neg liver ROS, PUD,GERD  Medicated,,  Endo/Other  negative endocrine ROS    Renal/GU      Musculoskeletal   Abdominal   Peds  Hematology  (+) Blood dyscrasia, anemia   Anesthesia Other Findings Past Medical History: No date: Blood transfusion without reported diagnosis No date: Cancer [facility])     Comment:  right leg 10/26/17 No date: Colon polyps No date: Gastritis No date: GIB (gastrointestinal bleeding) No date: Hypertension  Past Surgical History: No date: CHOLECYSTECTOMY 07/03/2015: ESOPHAGOGASTRODUODENOSCOPY (EGD) WITH PROPOFOL ; N/A     Comment:  Procedure: ESOPHAGOGASTRODUODENOSCOPY (EGD) WITH               PROPOFOL ;  Surgeon: Rogelia Copping, MD;  Location: [facility]               ENDOSCOPY;  Service: Endoscopy;  Laterality: N/A;     Reproductive/Obstetrics negative OB ROS                              Anesthesia Physical Anesthesia Plan  ASA: 3  Anesthesia Plan: General   Post-op Pain Management: Minimal or no pain anticipated   Induction: Intravenous  PONV Risk Score and Plan: 2 and Propofol  infusion and TIVA  Airway Management Planned: Nasal Cannula  Additional Equipment: None  Intra-op Plan:   Post-operative Plan:   Informed Consent: I have reviewed the patients History and Physical, chart, labs and discussed the procedure including the risks, benefits and alternatives for the proposed anesthesia with  the patient or authorized representative who has indicated his/her understanding and acceptance.     Dental advisory given  Plan Discussed with: CRNA and Surgeon  Anesthesia Plan Comments: (Discussed risks of anesthesia with patient, including possibility of difficulty with spontaneous ventilation under anesthesia necessitating airway intervention, PONV, and rare risks such as cardiac or respiratory or neurological events, and allergic reactions. Discussed the role of CRNA in patient's perioperative care. Patient understands.)        Anesthesia Quick Evaluation  "

## 2024-02-02 NOTE — Evaluation (Signed)
 Occupational Therapy Evaluation Patient Details Name: Veronica Moon MRN: [id number] DOB: Nov 29, 1945 Today's Date: 02/02/2024   History of Present Illness   Pt is a 79 y.o. year old female presenting to the ED with generalized weakness and report of dark stools. MD assessment: GI bleed. PMH of hypertension, hyperlipidemia, CKD 3a., GERD, PUD and history of GI bleeding     Clinical Impressions Pt was seen for OT evaluation this date. PTA, pt resides alone and is indep/MOD I with ADLs and mobility with intermittent use of RW, mostly uses at night for toileting. Reports driving and walking into stores, but dependent on use of shopping cart. Pt presents with deficits in strength and activity tolerance limiting their ability to perform ADL management at baseline level. Pt currently requires MOD I for bed mobility and supervision for STS and ambulation within the room pushing IV pole. LB ADLs simulated requiring CGA, mostly limited by breathing and inability to reach feet without cutting off 02 supply. Edu on ECS, pacing and use of DME/AE/AD to maximize ease with ADLs and prevent further SOB. Pt on cont pulse ox telemetry with desat to 85% with bad pleth, quickly back to 95% at rest on RA. Pt would benefit from skilled OT services to address noted impairments and functional limitations to maximize safety and independence while minimizing future risk of falls, injury, and readmission. Do anticipate the need for follow up OT services upon acute hospital DC.      If plan is discharge home, recommend the following:   A little help with walking and/or transfers;A little help with bathing/dressing/bathroom;Assistance with cooking/housework     Functional Status Assessment   Patient has had a recent decline in their functional status and demonstrates the ability to make significant improvements in function in a reasonable and predictable amount of time.     Equipment Recommendations    BSC/3in1     Recommendations for Other Services         Precautions/Restrictions   Precautions Precautions: Fall Restrictions Weight Bearing Restrictions Per Provider Order: No     Mobility Bed Mobility Overal bed mobility: Modified Independent, Independent                  Transfers Overall transfer level: Needs assistance   Transfers: Sit to/from Stand Sit to Stand: Supervision           General transfer comment: pushed IV pole to ambulate ~10 ft within the room with good stability, fatigues easily d/t GI bleed; sp02 85-95% during session on tele      Balance Overall balance assessment: Modified Independent                                         ADL either performed or assessed with clinical judgement   ADL Overall ADL's : Needs assistance/impaired                     Lower Body Dressing: Contact guard assist;Sitting/lateral leans;Sit to/from stand Lower Body Dressing Details (indicate cue type and reason): simulated via reaching while seated EOB, edu on use of long handled equipment to maximize ease/ECS             Functional mobility during ADLs: Contact guard assist       Vision         Perception  Praxis         Pertinent Vitals/Pain Pain Assessment Pain Assessment: No/denies pain     Extremity/Trunk Assessment Upper Extremity Assessment Upper Extremity Assessment: Overall WFL for tasks assessed;Generalized weakness   Lower Extremity Assessment Lower Extremity Assessment: Generalized weakness       Communication Communication Communication: No apparent difficulties   Cognition Arousal: Alert Behavior During Therapy: WFL for tasks assessed/performed Cognition: No apparent impairments                               Following commands: Intact       Cueing  General Comments   Cueing Techniques: Verbal cues  fatigues easily with SOB noted during session with minimal  activity   Exercises Other Exercises Other Exercises: Edu on role of OT in acute setting and use of ECS and pacing as well as DME/AE/AD to maximize safety/IND.   Shoulder Instructions      Home Living Family/patient expects to be discharged to:: Private residence Living Arrangements: Alone Available Help at Discharge: Family;Available PRN/intermittently Type of Home: House Home Access: Stairs to enter [employer] of Steps: 6 Entrance Stairs-Rails: Right Home Layout: One level     Bathroom Shower/Tub: Producer, Television/film/video: Handicapped height     Home Equipment: Agricultural Consultant (2 wheels);Grab bars - toilet;Shower seat;Grab bars - tub/shower   Additional Comments: (P) RW typically for overnight mobility;      Prior Functioning/Environment Prior Level of Function : Independent/Modified Independent;Driving             Mobility Comments: mostly household AMB without device, RW for overnight mobility to/from BR; reluctant to access community without a shopping cart available. ADLs Comments: independent    OT Problem List: Decreased activity tolerance;Decreased strength   OT Treatment/Interventions: Self-care/ADL training;Patient/family education;Therapeutic exercise;Balance training;Energy conservation;Therapeutic activities;DME and/or AE instruction      OT Goals(Current goals can be found in the care plan section)   Acute Rehab OT Goals Patient Stated Goal: improve breathing/strength OT Goal Formulation: With patient/family Time For Goal Achievement: 02/16/24 Potential to Achieve Goals: Good ADL Goals Pt Will Perform Lower Body Dressing: with modified independence;sitting/lateral leans;sit to/from stand Pt Will Transfer to Toilet: with modified independence;ambulating Additional ADL Goal #1: Pt will demo implementation of 1 learned ECS during ADL performance 2/2 trials to maximize safety and IND while preventing overexertion.   OT Frequency:   Min 2X/week    Co-evaluation              AM-PAC OT 6 Clicks Daily Activity     Outcome Measure Help from another person eating meals?: None Help from another person taking care of personal grooming?: None Help from another person toileting, which includes using toliet, bedpan, or urinal?: A Little Help from another person bathing (including washing, rinsing, drying)?: A Little Help from another person to put on and taking off regular upper body clothing?: None Help from another person to put on and taking off regular lower body clothing?: A Little 6 Click Score: 21   End of Session Nurse Communication: Mobility status  Activity Tolerance: Patient tolerated treatment well Patient left: in bed;with call bell/phone within reach;with family/visitor present  OT Visit Diagnosis: Other abnormalities of gait and mobility (R26.89)                Time: 9172-9153 OT Time Calculation (min): 19 min Charges:  OT General Charges $OT Visit: 1 Visit OT  Evaluation $OT Eval Low Complexity: 1 Low  Veronica Moon, OTR/L 02/02/2024, 9:44 AM  Veronica Moon 02/02/2024, 9:43 AM

## 2024-02-02 NOTE — Plan of Care (Signed)

## 2024-02-03 DIAGNOSIS — K922 Gastrointestinal hemorrhage, unspecified: Secondary | ICD-10-CM | POA: Diagnosis not present

## 2024-02-03 LAB — BASIC METABOLIC PANEL WITH GFR
Anion gap: 10 (ref 5–15)
BUN: 24 mg/dL — ABNORMAL HIGH (ref 8–23)
CO2: 23 mmol/L (ref 22–32)
Calcium: 9.3 mg/dL (ref 8.9–10.3)
Chloride: 104 mmol/L (ref 98–111)
Creatinine, Ser: 1.29 mg/dL — ABNORMAL HIGH (ref 0.44–1.00)
GFR, Estimated: 42 mL/min — ABNORMAL LOW
Glucose, Bld: 99 mg/dL (ref 70–99)
Potassium: 4 mmol/L (ref 3.5–5.1)
Sodium: 137 mmol/L (ref 135–145)

## 2024-02-03 LAB — GLUCOSE, CAPILLARY: Glucose-Capillary: 126 mg/dL — ABNORMAL HIGH (ref 70–99)

## 2024-02-03 LAB — HEMOGLOBIN AND HEMATOCRIT, BLOOD
HCT: 22.2 % — ABNORMAL LOW (ref 36.0–46.0)
HCT: 23 % — ABNORMAL LOW (ref 36.0–46.0)
Hemoglobin: 6.9 g/dL — ABNORMAL LOW (ref 12.0–15.0)
Hemoglobin: 7.1 g/dL — ABNORMAL LOW (ref 12.0–15.0)

## 2024-02-03 LAB — HEMOGLOBIN: Hemoglobin: 9.4 g/dL — ABNORMAL LOW (ref 12.0–15.0)

## 2024-02-03 LAB — PREPARE RBC (CROSSMATCH)

## 2024-02-03 MED ORDER — VITAMIN B-12 1000 MCG PO TABS
1000.0000 ug | ORAL_TABLET | Freq: Every day | ORAL | Status: DC
  Administered 2024-02-03 – 2024-02-04 (×2): 1000 ug via ORAL
  Filled 2024-02-03 (×2): qty 1

## 2024-02-03 MED ORDER — TORSEMIDE 20 MG PO TABS
20.0000 mg | ORAL_TABLET | Freq: Every day | ORAL | Status: DC
  Administered 2024-02-03: 20 mg via ORAL
  Filled 2024-02-03: qty 1

## 2024-02-03 MED ORDER — FERROUS SULFATE 325 (65 FE) MG PO TABS
325.0000 mg | ORAL_TABLET | Freq: Two times a day (BID) | ORAL | Status: DC
  Administered 2024-02-03 – 2024-02-04 (×3): 325 mg via ORAL
  Filled 2024-02-03 (×3): qty 1

## 2024-02-03 MED ORDER — VITAMIN D 25 MCG (1000 UNIT) PO TABS
1000.0000 [IU] | ORAL_TABLET | Freq: Every day | ORAL | Status: DC
  Administered 2024-02-03 – 2024-02-04 (×2): 1000 [IU] via ORAL
  Filled 2024-02-03 (×2): qty 1

## 2024-02-03 MED ORDER — ATORVASTATIN CALCIUM 20 MG PO TABS
20.0000 mg | ORAL_TABLET | Freq: Every day | ORAL | Status: DC
  Administered 2024-02-03 – 2024-02-04 (×2): 20 mg via ORAL
  Filled 2024-02-03 (×2): qty 1

## 2024-02-03 MED ORDER — SODIUM CHLORIDE 0.9% IV SOLUTION: Freq: Once | INTRAVENOUS | Status: DC

## 2024-02-03 MED ORDER — LOSARTAN POTASSIUM 50 MG PO TABS
50.0000 mg | ORAL_TABLET | Freq: Every day | ORAL | Status: DC
  Administered 2024-02-03: 50 mg via ORAL
  Filled 2024-02-03: qty 1

## 2024-02-03 MED ORDER — HYDROXYZINE HCL 25 MG PO TABS
12.5000 mg | ORAL_TABLET | Freq: Three times a day (TID) | ORAL | Status: DC | PRN
  Filled 2024-02-03: qty 1

## 2024-02-03 MED ORDER — PANTOPRAZOLE SODIUM 40 MG PO TBEC
40.0000 mg | DELAYED_RELEASE_TABLET | Freq: Every day | ORAL | Status: DC
  Administered 2024-02-03 – 2024-02-04 (×2): 40 mg via ORAL
  Filled 2024-02-03 (×2): qty 1

## 2024-02-03 NOTE — Anesthesia Postprocedure Evaluation (Signed)
"   Anesthesia Post Note  Patient: Veronica Moon  Procedure(s) Performed: EGD (ESOPHAGOGASTRODUODENOSCOPY)  Patient location during evaluation: Endoscopy Anesthesia Type: General Level of consciousness: awake and alert Pain management: pain level controlled Vital Signs Assessment: post-procedure vital signs reviewed and stable Respiratory status: spontaneous breathing, nonlabored ventilation, respiratory function stable and patient connected to nasal cannula oxygen Cardiovascular status: blood pressure returned to baseline and stable Postop Assessment: no apparent nausea or vomiting Anesthetic complications: no   No notable events documented.   Last Vitals:  Vitals:   02/02/24 2125 02/03/24 0400  BP: (!) 156/60 (!) 148/58  Pulse: 73 71  Resp: 17   Temp: 36.7 C 36.7 C  SpO2: 98% 100%    Last Pain:  Vitals:   02/03/24 0400  TempSrc: Oral  PainSc:                  Debby Mines      "

## 2024-02-03 NOTE — Progress Notes (Signed)
 " PROGRESS NOTE    Veronica Moon  FMW:[id number] DOB: [birth year] DOA: 02/01/2024 PCP: Don Lauraine Collar, NP    Brief Narrative:  79 y.o. year old female with medical history of hypertension, hyperlipidemia, CKD 3a., GERD, PUD and history of GI bleeding presenting to the ED with generalized weakness and report of dark stools. States she can tell the difference from her stool color when she is only taking iron . No NSAID, goody's power use. Her weakness has been progressive over the last 2 weeks.  States has loose stools over the last 1-2 weeks. She presented to the ED yesterday but left given the long wait time. On arrival to the ED patient was noted to be HDS stable.  Lab work showed CBC with hemoglobin at 8.5, yesterday was 9.4.  In 08/2023 hemoglobin was 12.6 and baseline appears to be around.  CMP showed elevated creatinine consistent with CKD 3a.  Chest x-ray obtained yesterday did not show any acute findings.  Given her weakness and drop in hemoglobin, [facility] contacted for admission.    Assessment & Plan:   Principal Problem:   GI bleeding Active Problems:   Essential hypertension   Hyperlipidemia   GERD (gastroesophageal reflux disease)   Symptomatic anemia   Acute gastric ulcer  GI bleed Acute blood loss anemia Patient underwent upper endoscopy on 1/7 with no acute bleed noted.  On 1/8 hemoglobin has drifted down to 6.9.  Patient complains of crampy lower abdominal pain in the last 30 minutes with no clear inciting or alleviating factors Plan: Transfuse 1 unit PRBC P.o. PPI daily Monitor hemoglobin  AKI Unknown etiology.  Creatinine has deteriorated during course of admission. Primary differential consideration is prerenal azotemia but unable to exclude other causes Nonoliguric renal failure Plan: Defer renal ultrasound at this time as patient is making urine Monitor renal function Avoid nephrotoxins  Hypertension:  Resume home BP regimen   Hyperlipidemia:   Continue home statin   Normocytic anemia:  Per PCP notes, her anemia secondary to her repeated episodes of GI bleed.   She is on chronic iron  replacement therapy.   Received IV Venofer  300 mg x 1 on 1/7  Plan:  Restart oral iron    DVT prophylaxis: SCD Code Status: Full Family Communication: Family members at bedside 1/7, 1/8 Disposition Plan: Status is: Observation The patient will require care spanning > 2 midnights and should be moved to inpatient because: Acute kidney injury, acute symptomatic anemia secondary to GI bleed requiring blood transfusion.   Level of care: Telemetry  Consultants:  GI  Procedures:  EGD  Antimicrobials: None   Subjective: Seen and examined.  Sitting up in chair.  Daughter at bedside.  Endorses lower abdominal discomfort.  Objective: Vitals:   02/02/24 2125 02/03/24 0400 02/03/24 0456 02/03/24 0800  BP: (!) 156/60 (!) 148/58  (!) 149/52  Pulse: 73 71  82  Resp: 17   17  Temp: 98 F (36.7 C) 98.1 F (36.7 C)  97.9 F (36.6 C)  TempSrc:  Oral  Oral  SpO2: 98% 100%  100%  Weight:   108.1 kg   Height:        Intake/Output Summary (Last 24 hours) at 02/03/2024 1126 Last data filed at 02/03/2024 0400 Gross per 24 hour  Intake 1150 ml  Output --  Net 1150 ml   Filed Weights   02/01/24 1217 02/02/24 0500 02/03/24 0456  Weight: 102 kg 104.8 kg 108.1 kg    Examination:  General exam: NAD Respiratory  system: Clear to auscultation. Respiratory effort normal. Cardiovascular system: S1-S2, RRR, no murmurs, no pedal edema Gastrointestinal system: Soft, nondistended, mild tender to palpation lower abdomen, normal bowel sounds Central nervous system: Alert and oriented. No focal neurological deficits. Extremities: Symmetric 5 x 5 power. Skin: Pale with no obvious rashes or lesions Psychiatry: Judgement and insight appear normal. Mood & affect appropriate.     Data Reviewed: I have personally reviewed following labs and imaging  studies  CBC: Recent Labs  Lab 01/31/24 1641 02/01/24 1221 02/01/24 1732 02/02/24 0541 02/02/24 1037 02/02/24 1737 02/03/24 0226 02/03/24 1010  WBC 7.8 6.0  --  6.0  --   --   --   --   HGB 9.4* 8.5*   < > 7.4* 7.5* 7.5* 6.9* 7.1*  HCT 30.6* 27.6*   < > 24.4* 24.1* 23.5* 22.2* 23.0*  MCV 95.0 96.5  --  96.4  --   --   --   --   PLT 376 307  --  256  --   --   --   --    < > = values in this interval not displayed.   Basic Metabolic Panel: Recent Labs  Lab 01/31/24 1641 02/01/24 1221 02/02/24 0541  NA 134* 138 139  K 4.8 4.0 4.1  CL 102 104 105  CO2 19* 19* 22  GLUCOSE 128* 125* 95  BUN 43* 36* 35*  CREATININE 1.03* 1.23* 1.47*  CALCIUM  9.1 9.4 9.2  MG  --  1.8  --    GFR: Estimated Creatinine Clearance: 40.6 mL/min (A) (by C-G formula based on SCr of 1.47 mg/dL (H)). Liver Function Tests: Recent Labs  Lab 01/31/24 1641 02/01/24 1221  AST 15 15  ALT 13 10  ALKPHOS 103 96  BILITOT 0.3 0.3  PROT 7.0 6.8  ALBUMIN 4.1 4.1   No results for input(s): LIPASE, AMYLASE in the last 168 hours. No results for input(s): AMMONIA in the last 168 hours. Coagulation Profile: No results for input(s): INR, PROTIME in the last 168 hours. Cardiac Enzymes: No results for input(s): CKTOTAL, CKMB, CKMBINDEX, TROPONINI in the last 168 hours. BNP (last 3 results) No results for input(s): PROBNP in the last 8760 hours. HbA1C: No results for input(s): HGBA1C in the last 72 hours. CBG: Recent Labs  Lab 02/02/24 0807 02/03/24 0803  GLUCAP 98 126*   Lipid Profile: No results for input(s): CHOL, HDL, LDLCALC, TRIG, CHOLHDL, LDLDIRECT in the last 72 hours. Thyroid  Function Tests: No results for input(s): TSH, T4TOTAL, FREET4, T3FREE, THYROIDAB in the last 72 hours. Anemia Panel: No results for input(s): VITAMINB12, FOLATE, FERRITIN, TIBC, IRON , RETICCTPCT in the last 72 hours. Sepsis Labs: No results for input(s):  PROCALCITON, LATICACIDVEN in the last 168 hours.  No results found for this or any previous visit (from the past 240 hours).       Radiology Studies: No results found.       Scheduled Meds:  sodium chloride    Intravenous Once   pantoprazole   40 mg Oral Daily   sodium chloride  flush  3 mL Intravenous Q12H   Continuous Infusions:     LOS: 0 days    Calvin KATHEE Robson, MD [facility]   If 7PM-7AM, please contact night-coverage  02/03/2024, 11:26 AM   "

## 2024-02-03 NOTE — Progress Notes (Addendum)
 Occupational Therapy Treatment Patient Details Name: Veronica Moon MRN: [id number] DOB: [birth year] Today's Date: 02/03/2024   History of present illness Pt is a 79 y.o. year old female presenting to the ED with generalized weakness and report of dark stools. MD assessment: GI bleed. PMH of hypertension, hyperlipidemia, CKD 3a., GERD, PUD and history of GI bleeding   OT comments  Pt is seated in recliner on arrival. Pleasant and agreeable to OT session. She reports LUE soreness at IV site from recent blood transfusion. Pt performed ADL transfers and mobility during session at SBA/SUPv level using RW with VSS, good safety and no LOB or DOE this date. Increased effort and use of grab bar to stand from low toilet. Provided theraband and written HEP with edu on BUE strengthening exercises with pt demo back good carryover using RUE only d/t LUE soreness to maximize strength and endurance to promote return to PLOF. Pt left in recliner with all needs in place and will cont to require skilled acute OT services to maximize his safety and IND to return to PLOF.      If plan is discharge home, recommend the following:  A little help with walking and/or transfers;A little help with bathing/dressing/bathroom;Assistance with cooking/housework   Equipment Recommendations  BSC/3in1    Recommendations for Other Services      Precautions / Restrictions Precautions Precautions: Fall Restrictions Weight Bearing Restrictions Per Provider Order: No       Mobility Bed Mobility               General bed mobility comments: NT up in recliner pre/post session    Transfers Overall transfer level: Needs assistance Equipment used: Rolling walker (2 wheels) Transfers: Sit to/from Stand Sit to Stand: Supervision           General transfer comment: stands from recliner and ambulates ~40-45 ft with supervision and VSS no DOE or LOB noted     Balance Overall balance assessment: Needs  assistance   Sitting balance-Leahy Scale: Normal     Standing balance support: Bilateral upper extremity supported, During functional activity Standing balance-Leahy Scale: Good                             ADL either performed or assessed with clinical judgement   ADL Overall ADL's : Needs assistance/impaired                         Toilet Transfer: Supervision/safety;Regular Toilet;Rolling walker (2 wheels);Grab bars Toilet Transfer Details (indicate cue type and reason): use of grab bar and increased effort to stand from low toilet, but no physical assist required Toileting- Clothing Manipulation and Hygiene: Modified independent;Sitting/lateral lean Toileting - Clothing Manipulation Details (indicate cue type and reason): after cont urine on toilet     Functional mobility during ADLs: Supervision/safety;Rolling walker (2 wheels)      Extremity/Trunk Assessment              Vision       Perception     Praxis     Communication Communication Communication: No apparent difficulties   Cognition Arousal: Alert Behavior During Therapy: WFL for tasks assessed/performed                                 Following commands: Intact        Cueing  Cueing Techniques: Verbal cues  Exercises Other Exercises Other Exercises: Provided red theraband and written HEP handout and edu on 3 BUE strengthening exercises to maximize strength/endurance with pt demo back to therapist with good carryover using RUE only d/t LUE soreness from blood transfusion earlier this date. Pt's 3 daughters present during session and will cont to have pt perform exercises 2x/day as tolerated.    Shoulder Instructions       General Comments      Pertinent Vitals/ Pain       Pain Assessment Pain Assessment: Faces Faces Pain Scale: Hurts little more Pain Location: LUE at IV site where blood transfusion given this date Pain Descriptors / Indicators: Sore Pain  Intervention(s): Monitored during session, Repositioned  Home Living                                          Prior Functioning/Environment              Frequency  Min 2X/week        Progress Toward Goals  OT Goals(current goals can now be found in the care plan section)  Progress towards OT goals: Progressing toward goals  Acute Rehab OT Goals Patient Stated Goal: improve strength OT Goal Formulation: With patient/family Time For Goal Achievement: 02/16/24 Potential to Achieve Goals: Good  Plan      Co-evaluation                 AM-PAC OT 6 Clicks Daily Activity     Outcome Measure   Help from another person eating meals?: None Help from another person taking care of personal grooming?: None Help from another person toileting, which includes using toliet, bedpan, or urinal?: A Little Help from another person bathing (including washing, rinsing, drying)?: A Little Help from another person to put on and taking off regular upper body clothing?: None Help from another person to put on and taking off regular lower body clothing?: A Little 6 Click Score: 21    End of Session Equipment Utilized During Treatment: Rolling walker (2 wheels)  OT Visit Diagnosis: Other abnormalities of gait and mobility (R26.89)   Activity Tolerance Patient tolerated treatment well   Patient Left with call bell/phone within reach;in chair;with family/visitor present   Nurse Communication Mobility status        Time: 8342-8287 OT Time Calculation (min): 15 min  Charges: OT General Charges $OT Visit: 1 Visit OT Treatments $Therapeutic Exercise: 8-22 mins  Veronica Moon, OTR/L  02/03/2024, 5:23 PM   Veronica Moon 02/03/2024, 5:21 PM

## 2024-02-03 NOTE — TOC Initial Note (Signed)
 Transition of Care [facility]) - Initial/Assessment Note    Patient Details  Name: Veronica Moon MRN: [id number] Date of Birth: 1945/04/26  [facility] CM/SW Contact:    Corean ONEIDA Haddock, RN Phone Number: 02/03/2024, 11:20 AM  Clinical Narrative:                  Admitted for: GI bleed Admitted from: home alone ERE:Hjlhzm, Lauraine Collar, NP   Current home health/prior home health/DME: RW   Therapy recommending HH and BSC.  Reviewed recs with patient.  She declines both.  She is aware that should she change her mind while in the hospital to reach out to CM, or if she changes her mind after returning home to reach out to her PCP  Patient states that daughter to transport at discharge      Patient Goals and CMS Choice            Expected Discharge Plan and Services                                              Prior Living Arrangements/Services                       Activities of Daily Living   ADL Screening (condition at time of admission) Independently performs ADLs?: No Does the patient have a NEW difficulty with bathing/dressing/toileting/self-feeding that is expected to last >3 days?: Yes (Initiates electronic notice to provider for possible OT consult) Does the patient have a NEW difficulty with getting in/out of bed, walking, or climbing stairs that is expected to last >3 days?: No Does the patient have a NEW difficulty with communication that is expected to last >3 days?: No Is the patient deaf or have difficulty hearing?: No Does the patient have difficulty seeing, even when wearing glasses/contacts?: No Does the patient have difficulty concentrating, remembering, or making decisions?: No  Permission Sought/Granted                  Emotional Assessment              Admission diagnosis:  GI bleeding [K92.2] Patient Active Problem List   Diagnosis Date Noted   Acute gastric ulcer 02/02/2024   GI bleeding  02/01/2024   Essential hypertension 02/01/2024   Hyperlipidemia 02/01/2024   GERD (gastroesophageal reflux disease) 02/01/2024   Symptomatic anemia 02/01/2024   Special screening for malignant neoplasms, colon    Polyp of sigmoid colon    Diverticulosis of large intestine without diverticulitis    Gastric erosion    CLE (columnar lined esophagus)    Hiatal hernia    Gastric polyp    Iron  deficiency anemia due to chronic blood loss 01/09/2018   Melena 12/04/2017   Bleeding gastrointestinal    Peptic ulcer of stomach    GIB (gastrointestinal bleeding) 07/02/2015   PCP:  Don Lauraine Collar, NP Pharmacy:   [phone] [address], [place] - [address] [place] [address] Phone: [phone] Fax: [phone]     Social Drivers of Health (SDOH) Social History: SDOH Screenings   Food Insecurity: No Food Insecurity (02/01/2024)  Housing: Low Risk (02/01/2024)  Transportation Needs: No Transportation Needs (02/01/2024)  Utilities: Not At Risk (02/01/2024)  Financial Resource Strain: Low Risk  (11/16/2023)   [facility] from [facility]  Social Connections: Unknown (02/01/2024)  Tobacco Use: Low Risk (02/02/2024)   SDOH Interventions:     Readmission Risk Interventions     No data to display

## 2024-02-03 NOTE — Plan of Care (Signed)

## 2024-02-03 NOTE — Progress Notes (Signed)
 Physical Therapy Treatment Patient Details Name: Veronica Moon MRN: [id number] DOB: 08/31/1945 Today's Date: 02/03/2024   History of Present Illness Pt is a 79 y.o. year old female presenting to the ED with generalized weakness and report of dark stools. MD assessment: GI bleed. PMH of hypertension, hyperlipidemia, CKD 3a., GERD, PUD and history of GI bleeding    PT Comments  Pt completed transfusion this PM with MD contacted and giving OK for pt to participate with PT services.  Pt required min extra time and effort with functional tasks but no physical assistance during the session and put forth very good effort throughout.  Pt demonstrated good eccentric and concentric control and stability with transfers from various height surfaces and was able to amb a self-selected max comfortable distance of around 60 feet this session with no adverse symptoms and with SpO2 and HR WNL on room air.  Pt subjectively stated that she was feeling better this date than yesterday.  Pt will benefit from continued PT services upon discharge to safely address deficits listed in patient problem list for decreased caregiver assistance and eventual return to PLOF.    If plan is discharge home, recommend the following: Assist for transportation;Assistance with cooking/housework;Help with stairs or ramp for entrance;A little help with walking and/or transfers   Can travel by private vehicle        Equipment Recommendations  None recommended by PT    Recommendations for Other Services       Precautions / Restrictions Precautions Precautions: Fall Restrictions Weight Bearing Restrictions Per Provider Order: No     Mobility  Bed Mobility Overal bed mobility: Modified Independent             General bed mobility comments: Min extra time and effort only    Transfers Overall transfer level: Needs assistance Equipment used: Rolling walker (2 wheels) Transfers: Sit to/from Stand Sit to Stand:  Supervision           General transfer comment: Good eccentric and concentric control and stability from multiple height surfaces with no LOB upon initial stand    Ambulation/Gait Ambulation/Gait assistance: Supervision Gait Distance (Feet): 12 Feet x 1, 60 Feet x 1 Assistive device: Rolling walker (2 wheels) Gait Pattern/deviations: Step-through pattern, Decreased step length - right, Decreased step length - left Gait velocity: decreased     General Gait Details: Min decreased cadence and reduced step length but steady without LOB and with no adverse symptoms   Stairs             Wheelchair Mobility     Tilt Bed    Modified Rankin (Stroke Patients Only)       Balance Overall balance assessment: Needs assistance   Sitting balance-Leahy Scale: Normal     Standing balance support: Bilateral upper extremity supported, During functional activity Standing balance-Leahy Scale: Good                              Communication Communication Communication: No apparent difficulties  Cognition Arousal: Alert Behavior During Therapy: WFL for tasks assessed/performed   PT - Cognitive impairments: No apparent impairments                         Following commands: Intact      Cueing Cueing Techniques: Verbal cues  Exercises Other Exercises Other Exercises: Pt education provided on benefits of mobility progression and role of PT  in acute care    General Comments        Pertinent Vitals/Pain Pain Assessment Pain Assessment: No/denies pain    Home Living                          Prior Function            PT Goals (current goals can now be found in the care plan section) Progress towards PT goals: Progressing toward goals    Frequency    Min 2X/week      PT Plan      Co-evaluation              AM-PAC PT 6 Clicks Mobility   Outcome Measure  Help needed turning from your back to your side while in a  flat bed without using bedrails?: None Help needed moving from lying on your back to sitting on the side of a flat bed without using bedrails?: None Help needed moving to and from a bed to a chair (including a wheelchair)?: A Little Help needed standing up from a chair using your arms (e.g., wheelchair or bedside chair)?: A Little Help needed to walk in hospital room?: A Little Help needed climbing 3-5 steps with a railing? : A Little 6 Click Score: 20    End of Session Equipment Utilized During Treatment: Gait belt Activity Tolerance: Patient tolerated treatment well Patient left: in chair;with call bell/phone within reach;with family/visitor present Nurse Communication: Mobility status PT Visit Diagnosis: Other abnormalities of gait and mobility (R26.89);Unsteadiness on feet (R26.81);Difficulty in walking, not elsewhere classified (R26.2)     Time: 8479-8460 PT Time Calculation (min) (ACUTE ONLY): 19 min  Charges:    $Therapeutic Activity: 8-22 mins PT General Charges $$ ACUTE PT VISIT: 1 Visit                     D. Glendia Bertin PT, DPT 02/03/2024, 3:53 PM

## 2024-02-04 ENCOUNTER — Encounter: Payer: Self-pay | Admitting: Oncology

## 2024-02-04 ENCOUNTER — Telehealth (HOSPITAL_COMMUNITY): Payer: Self-pay

## 2024-02-04 ENCOUNTER — Telehealth (HOSPITAL_COMMUNITY): Payer: Self-pay | Admitting: Pharmacist

## 2024-02-04 ENCOUNTER — Other Ambulatory Visit: Payer: Self-pay

## 2024-02-04 DIAGNOSIS — K922 Gastrointestinal hemorrhage, unspecified: Secondary | ICD-10-CM | POA: Diagnosis not present

## 2024-02-04 LAB — GLUCOSE, CAPILLARY: Glucose-Capillary: 107 mg/dL — ABNORMAL HIGH (ref 70–99)

## 2024-02-04 LAB — TYPE AND SCREEN
ABO/RH(D): A POS
Antibody Screen: NEGATIVE
Unit division: 0

## 2024-02-04 LAB — CBC WITH DIFFERENTIAL/PLATELET
Abs Immature Granulocytes: 0.01 K/uL (ref 0.00–0.07)
Basophils Absolute: 0.1 K/uL (ref 0.0–0.1)
Basophils Relative: 1 %
Eosinophils Absolute: 0.1 K/uL (ref 0.0–0.5)
Eosinophils Relative: 2 %
HCT: 26.2 % — ABNORMAL LOW (ref 36.0–46.0)
Hemoglobin: 8.5 g/dL — ABNORMAL LOW (ref 12.0–15.0)
Immature Granulocytes: 0 %
Lymphocytes Relative: 22 %
Lymphs Abs: 1 K/uL (ref 0.7–4.0)
MCH: 29.5 pg (ref 26.0–34.0)
MCHC: 32.4 g/dL (ref 30.0–36.0)
MCV: 91 fL (ref 80.0–100.0)
Monocytes Absolute: 0.5 K/uL (ref 0.1–1.0)
Monocytes Relative: 10 %
Neutro Abs: 3.1 K/uL (ref 1.7–7.7)
Neutrophils Relative %: 65 %
Platelets: 233 K/uL (ref 150–400)
RBC: 2.88 MIL/uL — ABNORMAL LOW (ref 3.87–5.11)
RDW: 15.8 % — ABNORMAL HIGH (ref 11.5–15.5)
WBC: 4.8 K/uL (ref 4.0–10.5)
nRBC: 0 % (ref 0.0–0.2)

## 2024-02-04 LAB — BPAM RBC
Blood Product Expiration Date: 202601172359
ISSUE DATE / TIME: 202601081155
Unit Type and Rh: 600

## 2024-02-04 LAB — BASIC METABOLIC PANEL WITH GFR
Anion gap: 11 (ref 5–15)
BUN: 21 mg/dL (ref 8–23)
CO2: 25 mmol/L (ref 22–32)
Calcium: 9 mg/dL (ref 8.9–10.3)
Chloride: 102 mmol/L (ref 98–111)
Creatinine, Ser: 1.35 mg/dL — ABNORMAL HIGH (ref 0.44–1.00)
GFR, Estimated: 40 mL/min — ABNORMAL LOW
Glucose, Bld: 98 mg/dL (ref 70–99)
Potassium: 3.5 mmol/L (ref 3.5–5.1)
Sodium: 138 mmol/L (ref 135–145)

## 2024-02-04 MED ORDER — PANTOPRAZOLE SODIUM 40 MG PO TBEC
40.0000 mg | DELAYED_RELEASE_TABLET | Freq: Every day | ORAL | 0 refills | Status: AC
  Filled 2024-02-04: qty 60, 60d supply, fill #0

## 2024-02-04 NOTE — Telephone Encounter (Signed)
 Patient referred to infusion pharmacy team for ambulatory infusion of IV iron .  Insurance - [employer] Site of care - Site of care: [facility] Dx code - K92.2 IV Iron  Therapy - Venofer  300mg  x 2 Infusion appointments - Scheduling team will schedule patient as soon as possible.    Venofer  300mg  IV x 1 completed on 02/02/2024  Garison Genova, PharmD, MPH, BCPS, CPP Clinical Pharmacist

## 2024-02-04 NOTE — Telephone Encounter (Signed)
 Auth Submission: NO AUTH NEEDED Site of care: Site of care: [facility] Payer: [employer] Medication & CPT/J Code(s) submitted: Venofer  (Iron  Sucrose) J1756 Diagnosis Code: D50.0, K92.2 Route of submission (phone, fax, portal):  Phone # Fax # Auth type: Buy/Bill HB Units/visits requested: 300mg  x 2 doses Reference number:  Approval from: 02/04/24 to 05/04/24

## 2024-02-04 NOTE — Discharge Instructions (Signed)
 [facility] will contact patient to schedule home assessment. Should you have additional questions or concerns. Please call [phone] [facility] is located [address] [place] [address].

## 2024-02-04 NOTE — Progress Notes (Signed)
 PT Cancellation Note  Patient Details Name: Veronica Moon MRN: [id number] DOB: 1945/11/19   Cancelled Treatment:    Reason Eval/Treat Not Completed: Other (comment) (Pt in chair, DTR at bedside. Pt reports progression in mobility, just completed 251ft wiith RW hallway with DTR, earlier AMB 29ft in room with RW. Plan for AMB twice again today with family.) Pt feels entry stairs will be easily navigable based on her recent endeavors. Again emphasized utility of HHPT services, pt not favoring, and also OPPT services should she continued to feel off baseline in coming weeks. Will continue to follow.   10:51 AM, 02/04/2024 Peggye JAYSON Linear, PT, DPT Physical Therapist - [facility]  [phone] (ASCOM)    Taiana Temkin C 02/04/2024, 10:50 AM

## 2024-02-04 NOTE — Plan of Care (Signed)

## 2024-02-04 NOTE — Discharge Summary (Signed)
 Physician Discharge Summary  Veronica Moon FMW:[id number] DOB: 1945/02/26 DOA: 02/01/2024  PCP: Don Lauraine Collar, NP  Admit date: 02/01/2024 Discharge date: 02/04/2024  Admitted From: Home Disposition:  Home w home health  Recommendations for Outpatient Follow-up:  Follow up with PCP in 1-2 weeks Outpatient follow-up with GI as directed  Home Health: Yes PT OT Equipment/Devices: None  Discharge Condition: Stable CODE STATUS: Full Diet recommendation: Regular  Brief/Interim Summary:  79 y.o. year old female with medical history of hypertension, hyperlipidemia, CKD 3a., GERD, PUD and history of GI bleeding presenting to the ED with generalized weakness and report of dark stools. States she can tell the difference from her stool color when she is only taking iron . No NSAID, goody's power use. Her weakness has been progressive over the last 2 weeks.  States has loose stools over the last 1-2 weeks. She presented to the ED yesterday but left given the long wait time. On arrival to the ED patient was noted to be HDS stable.  Lab work showed CBC with hemoglobin at 8.5, yesterday was 9.4.  In 08/2023 hemoglobin was 12.6 and baseline appears to be around.  CMP showed elevated creatinine consistent with CKD 3a.  Chest x-ray obtained yesterday did not show any acute findings.  Given her weakness and drop in hemoglobin, [facility] contacted for admission.    Discharge Diagnoses:  Principal Problem:   GI bleeding Active Problems:   Essential hypertension   Hyperlipidemia   GERD (gastroesophageal reflux disease)   Symptomatic anemia   Acute gastric ulcer  GI bleed Acute blood loss anemia Patient underwent upper endoscopy on 1/7 with no acute bleed noted.  On 1/8 hemoglobin has drifted down to 6.9.  Patient complains of crampy lower abdominal pain in the last 30 minutes with no clear inciting or alleviating factors Plan: Hemoglobin drifting down but relatively stable at time of discharge.   Patient feels better with no evidence of ongoing bleed.  Had a normal bowel movement on the day of discharge.  GI reengaged and agree no evidence of ongoing bleeding no indication for colonoscopy.  Discharge home with outpatient follow-up with GI.  Prescription obtained for p.o. PPI   AKI Unknown etiology.  Creatinine has deteriorated during course of admission. Primary differential consideration is prerenal azotemia but unable to exclude other causes Nonoliguric renal failure Plan: Defer renal ultrasound at this time as patient is making urine I suspect this is mild and secondary to intravascular volume depletion.  Encouraged on p.o. fluid intake and outpatient follow-up.  Patient has an outpatient nephrologist   Discharge Instructions  Discharge Instructions     Amb Referral to Intravenous Iron  Therapy   Complete by: As directed    You have been referred to [facility] Infusion team for IV Iron  Infusions. The infusion pharmacy team will reach out to you with appointment information.    Primary Diagnosis Code for IV Iron : D50.9 - Iron  deficiency Anemia   Secondary diagnosis code for IV iron : N18.x - CKD   Increase activity slowly   Complete by: As directed       Allergies as of 02/04/2024       Reactions   Amlodipine Other (See Comments)   Brain fog, confusion   Demerol [meperidine] Itching   Hydrochlorothiazide  Other (See Comments)   Hyponatermia   Nsaids Other (See Comments)   GI bleeding   Codeine Itching        Medication List     PAUSE taking these medications  amLODipine 5 MG tablet Wait to take this until your doctor or other care provider tells you to start again. Commonly known as: NORVASC Take 5 mg by mouth daily.       STOP taking these medications    traMADol  50 MG tablet Commonly known as: ULTRAM        TAKE these medications    atorvastatin  20 MG tablet Commonly known as: LIPITOR Take 20 mg by mouth daily.   cholecalciferol  25 MCG (1000  UNIT) tablet Commonly known as: VITAMIN D3 Take 1,000 Units by mouth daily.   cyanocobalamin  1000 MCG tablet Commonly known as: VITAMIN B12 Take 1,000 mcg by mouth daily.   ferrous sulfate  325 (65 FE) MG EC tablet Take 1 tablet (325 mg total) by mouth 2 (two) times daily. What changed: when to take this   hydrOXYzine  25 MG tablet Commonly known as: ATARAX  Take 12.5-25 mg by mouth every 8 (eight) hours as needed for anxiety.   losartan  50 MG tablet Commonly known as: COZAAR  Take 50 mg by mouth daily. Take 2 tablets a day   pantoprazole  40 MG tablet Commonly known as: PROTONIX  Take 1 tablet (40 mg total) by mouth daily. Start taking on: February 05, 2024   torsemide  20 MG tablet Commonly known as: DEMADEX  Take 20 mg by mouth daily.        Allergies[1]  Consultations: GI   Procedures/Studies: DG Chest 2 View Result Date: 01/31/2024 EXAM: 2 VIEW(S) XRAY OF THE CHEST 01/31/2024 05:02:00 PM COMPARISON: Chest radiograph 09/01/2016 and CT chest 09/01/2016. CLINICAL HISTORY: sob FINDINGS: LUNGS AND PLEURA: Shallow inspiration. Focal scarring or atelectasis in the left costophrenic angle. No airspace disease or consolidation in the lungs. No pleural effusion. No pneumothorax. HEART AND MEDIASTINUM: Heart size and pulmonary vascularity are normal. Large hiatal hernia behind the heart. Calcification of the aorta. BONES AND SOFT TISSUES: Degenerative changes in the spine. IMPRESSION: 1. No acute cardiopulmonary abnormality. 2. Large hiatal hernia behind the heart. Electronically signed by: Elsie Gravely MD 01/31/2024 05:50 PM EST RP Workstation: [id number]      Subjective: Seen and examined on the day of discharge.  Stable no distress.  Multiple daughters at bedside.  Questions answered.  Stable for discharge home.  Discharge Exam: Vitals:   02/04/24 0806 02/04/24 0843  BP: (!) 178/66 (!) 150/56  Pulse: 77 80  Resp: 17   Temp: 97.9 F (36.6 C)   SpO2: 100%    Vitals:    02/03/24 2036 02/04/24 0335 02/04/24 0806 02/04/24 0843  BP: (!) 158/61 (!) 124/55 (!) 178/66 (!) 150/56  Pulse: 69 69 77 80  Resp: 18 18 17    Temp: 98.9 F (37.2 C) 97.6 F (36.4 C) 97.9 F (36.6 C)   TempSrc:      SpO2: 100% 97% 100%   Weight:  104.3 kg    Height:        General: Pt is alert, awake, not in acute distress Cardiovascular: RRR, S1/S2 +, no rubs, no gallops Respiratory: CTA bilaterally, no wheezing, no rhonchi Abdominal: Soft, NT, ND, bowel sounds + Extremities: no edema, no cyanosis    The results of significant diagnostics from this hospitalization (including imaging, microbiology, ancillary and laboratory) are listed below for reference.     Microbiology: No results found for this or any previous visit (from the past 240 hours).   Labs: BNP (last 3 results) No results for input(s): BNP in the last 8760 hours. Basic Metabolic Panel: Recent Labs  Lab 01/31/24 1641  02/01/24 1221 02/02/24 0541 02/03/24 1214 02/04/24 0455  NA 134* 138 139 137 138  K 4.8 4.0 4.1 4.0 3.5  CL 102 104 105 104 102  CO2 19* 19* 22 23 25   GLUCOSE 128* 125* 95 99 98  BUN 43* 36* 35* 24* 21  CREATININE 1.03* 1.23* 1.47* 1.29* 1.35*  CALCIUM  9.1 9.4 9.2 9.3 9.0  MG  --  1.8  --   --   --    Liver Function Tests: Recent Labs  Lab 01/31/24 1641 02/01/24 1221  AST 15 15  ALT 13 10  ALKPHOS 103 96  BILITOT 0.3 0.3  PROT 7.0 6.8  ALBUMIN 4.1 4.1   No results for input(s): LIPASE, AMYLASE in the last 168 hours. No results for input(s): AMMONIA in the last 168 hours. CBC: Recent Labs  Lab 01/31/24 1641 02/01/24 1221 02/01/24 1732 02/02/24 0541 02/02/24 1037 02/02/24 1737 02/03/24 0226 02/03/24 1010 02/03/24 1621 02/04/24 0455  WBC 7.8 6.0  --  6.0  --   --   --   --   --  4.8  NEUTROABS  --   --   --   --   --   --   --   --   --  3.1  HGB 9.4* 8.5*   < > 7.4* 7.5* 7.5* 6.9* 7.1* 9.4* 8.5*  HCT 30.6* 27.6*   < > 24.4* 24.1* 23.5* 22.2* 23.0*  --   26.2*  MCV 95.0 96.5  --  96.4  --   --   --   --   --  91.0  PLT 376 307  --  256  --   --   --   --   --  233   < > = values in this interval not displayed.   Cardiac Enzymes: No results for input(s): CKTOTAL, CKMB, CKMBINDEX, TROPONINI in the last 168 hours. BNP: Invalid input(s): POCBNP CBG: Recent Labs  Lab 02/02/24 0807 02/03/24 0803 02/04/24 0808  GLUCAP 98 126* 107*   D-Dimer No results for input(s): DDIMER in the last 72 hours. Hgb A1c No results for input(s): HGBA1C in the last 72 hours. Lipid Profile No results for input(s): CHOL, HDL, LDLCALC, TRIG, CHOLHDL, LDLDIRECT in the last 72 hours. Thyroid  function studies No results for input(s): TSH, T4TOTAL, T3FREE, THYROIDAB in the last 72 hours.  Invalid input(s): FREET3 Anemia work up No results for input(s): VITAMINB12, FOLATE, FERRITIN, TIBC, IRON , RETICCTPCT in the last 72 hours. Urinalysis    Component Value Date/Time   COLORURINE COLORLESS (A) 02/01/2024 1325   APPEARANCEUR CLEAR (A) 02/01/2024 1325   APPEARANCEUR Cloudy 04/22/2014 2210   LABSPEC 1.005 02/01/2024 1325   LABSPEC 1.020 04/22/2014 2210   PHURINE 5.0 02/01/2024 1325   GLUCOSEU NEGATIVE 02/01/2024 1325   GLUCOSEU Negative 04/22/2014 2210   HGBUR NEGATIVE 02/01/2024 1325   BILIRUBINUR NEGATIVE 02/01/2024 1325   BILIRUBINUR Negative 04/22/2014 2210   KETONESUR NEGATIVE 02/01/2024 1325   PROTEINUR NEGATIVE 02/01/2024 1325   NITRITE NEGATIVE 02/01/2024 1325   LEUKOCYTESUR NEGATIVE 02/01/2024 1325   LEUKOCYTESUR Trace 04/22/2014 2210   Sepsis Labs Recent Labs  Lab 01/31/24 1641 02/01/24 1221 02/02/24 0541 02/04/24 0455  WBC 7.8 6.0 6.0 4.8   Microbiology No results found for this or any previous visit (from the past 240 hours).   Time coordinating discharge: 40 minutes  SIGNED:   Calvin KATHEE Robson, MD  [facility] 02/04/2024, 1:44 PM Pager   If 7PM-7AM, please contact  night-coverage     [1]  Allergies Allergen Reactions   Amlodipine Other (See Comments)    Brain fog, confusion   Demerol [Meperidine] Itching   Hydrochlorothiazide  Other (See Comments)    Hyponatermia   Nsaids Other (See Comments)    GI bleeding   Codeine Itching

## 2024-02-04 NOTE — Consult Note (Signed)
 "   GI Inpatient Consult Note  Reason for Consult: Anemia, GIB   Attending Requesting Consult: Dr. Calvin Robson  History of Present Illness: Veronica Moon is a 79 y.o. female seen for evaluation of anemia with possible GI bleed at the request of hospitalist - Dr. Calvin Robson. Patient has a PMH of obesity (BMI 34), HTN, HLD, CKD Stage IIIa, GERD, hx of UGIB 2/2 PUD, osteopenia, CAD, hx of BCC s/p excision, and GAD. She presented to the [facility] ED 1/6 for chief complaint of 2-week history of generalized weakness and dark stools. She was found to be anemic with hemoglobin 8.5. She underwent EGD 1/7 with Dr. Jinny which commented on medium-sized hiatal hernia, distal esophageal stricture (not dilated), few non-bleeding linear gastric ulcers with no stigmata of bleeding found in gastric body, and normal duodenum. GI signed off. She received 1 unit pRBCs yesterday afternoon and received 1 dose of IV iron  on 1/7 after her EGD. Hemoglobin this morning was found to be 8.5 and was 9.4 yesterday. GI was asked to sign back on.   Patient seen and examined this afternoon resting comfortably in bedside chair. Her daughter is present with her. She reports she feels well currently. She is no longer having generalized weakness. She had a nonbloody BM this morning. She is without any abdominal pain or abdominal cramping. She reports she was not taking a PPI prior to hospital admission. She has been taking Protonix  40 mg once daily during admission. No frequent indigestion or reflux symptoms. She denies any complaints of esophageal dysphagia. She feels like she is ready to go home.     Summary of GI Procedures:  EGD 02/02/2024 [facility]) - medium-sized hiatal hernia, distal esophageal stricture (not dilated), few non-bleeding linear gastric ulcers with no stigmata of bleeding found in gastric body, normal duodenum   EGD 03/23/2018 ([facility]) - one tongue of salmon-colored mucosa present with no other visible  abnormalities present, large hiatal hernia, four 3-4 mm sessile polyps with no bleeding found in gastric body, normal duodenum   CSY 03/23/2018 [place]) - sigmoid diverticulosis, three 3-4 mm hyperplastic polyps removed from sigmoid colon with clip placed  EGD 12/05/2017 ([facility]) - normal esophagus and GEJ, few 7-10 mm non-bleeding linear erosions found in gastric body, 2 cm hiatal hernia, single 4 mm sessile polyp found in gastric body, normal duodenum   EGD 07/03/2015 [facility]) - normal esophagus, many non-bleeding linear gastric ulcers with no stigmata of bleeding found in gastric body, normal duodenum   Past Medical History:  Past Medical History:  Diagnosis Date   Blood transfusion without reported diagnosis    Cancer (HCC)    right leg 10/26/17   Colon polyps    Gastritis    GIB (gastrointestinal bleeding)    Hyperlipidemia    Hypertension     Problem List: Patient Active Problem List   Diagnosis Date Noted   Acute gastric ulcer 02/02/2024   GI bleeding 02/01/2024   Essential hypertension 02/01/2024   Hyperlipidemia 02/01/2024   GERD (gastroesophageal reflux disease) 02/01/2024   Symptomatic anemia 02/01/2024   Special screening for malignant neoplasms, colon    Polyp of sigmoid colon    Diverticulosis of large intestine without diverticulitis    Gastric erosion    CLE (columnar lined esophagus)    Hiatal hernia    Gastric polyp    Iron  deficiency anemia due to chronic blood loss 01/09/2018   Melena 12/04/2017   Bleeding gastrointestinal    Peptic ulcer of stomach  GIB (gastrointestinal bleeding) 07/02/2015    Past Surgical History: Past Surgical History:  Procedure Laterality Date   CHOLECYSTECTOMY     COLONOSCOPY     COLONOSCOPY WITH PROPOFOL  N/A 03/23/2018   Procedure: COLONOSCOPY WITH PROPOFOL ;  Surgeon: Janalyn Keene NOVAK, MD;  Location: [facility] ENDOSCOPY;  Service: Endoscopy;  Laterality: N/A;   ESOPHAGOGASTRODUODENOSCOPY N/A 02/02/2024   Procedure: EGD  (ESOPHAGOGASTRODUODENOSCOPY);  Surgeon: Jinny Carmine, MD;  Location: [facility] ENDOSCOPY;  Service: Endoscopy;  Laterality: N/A;   ESOPHAGOGASTRODUODENOSCOPY (EGD) WITH PROPOFOL  N/A 07/03/2015   Procedure: ESOPHAGOGASTRODUODENOSCOPY (EGD) WITH PROPOFOL ;  Surgeon: Carmine Jinny, MD;  Location: [facility] ENDOSCOPY;  Service: Endoscopy;  Laterality: N/A;   ESOPHAGOGASTRODUODENOSCOPY (EGD) WITH PROPOFOL  N/A 03/23/2018   Procedure: ESOPHAGOGASTRODUODENOSCOPY (EGD) WITH PROPOFOL ;  Surgeon: Janalyn Keene NOVAK, MD;  Location: [facility] ENDOSCOPY;  Service: Endoscopy;  Laterality: N/A;   TUBAL LIGATION      Allergies: Allergies[1]  Home Medications: Medications Prior to Admission  Medication Sig Dispense Refill Last Dose/Taking   atorvastatin  (LIPITOR) 20 MG tablet Take 20 mg by mouth daily.   02/01/2024 Morning   cholecalciferol  (VITAMIN D3) 25 MCG (1000 UT) tablet Take 1,000 Units by mouth daily.   02/01/2024 Morning   ferrous sulfate  325 (65 FE) MG EC tablet Take 1 tablet (325 mg total) by mouth 2 (two) times daily. (Patient taking differently: Take 325 mg by mouth daily with breakfast.) 60 tablet 0 02/01/2024 Morning   hydrOXYzine  (ATARAX ) 25 MG tablet Take 12.5-25 mg by mouth every 8 (eight) hours as needed for anxiety.   Unknown   losartan  (COZAAR ) 50 MG tablet Take 50 mg by mouth daily. Take 2 tablets a day   02/01/2024 Morning   torsemide  (DEMADEX ) 20 MG tablet Take 20 mg by mouth daily.   02/01/2024 Morning   vitamin B-12 (CYANOCOBALAMIN ) 1000 MCG tablet Take 1,000 mcg by mouth daily.   02/01/2024 Morning   amLODipine (NORVASC) 5 MG tablet Take 5 mg by mouth daily. (Patient not taking: Reported on 02/01/2024)   Not Taking   traMADol  (ULTRAM ) 50 MG tablet Take by mouth. (Patient not taking: Reported on 02/01/2024)   Not Taking   Home medication reconciliation was completed with the patient.   Scheduled Inpatient Medications:    atorvastatin   20 mg Oral Daily   cholecalciferol   1,000 Units Oral Daily   cyanocobalamin   1,000  mcg Oral Daily   ferrous sulfate   325 mg Oral BID   pantoprazole   40 mg Oral Daily   sodium chloride  flush  3 mL Intravenous Q12H    Continuous Inpatient Infusions:    PRN Inpatient Medications:  acetaminophen  **OR** acetaminophen , hydrOXYzine , ondansetron  **OR** ondansetron  (ZOFRAN ) IV, senna-docusate  Family History: family history includes Heart attack in her father; Hypertension in her father.  The patient's family history is negative for inflammatory bowel disorders, GI malignancy, or solid organ transplantation.  Social History:   reports that she has never smoked. She has never used smokeless tobacco. She reports that she does not drink alcohol and does not use drugs. The patient denies ETOH, tobacco, or drug use.   Review of Systems: Constitutional: Weight is stable.  Eyes: No changes in vision. ENT: No oral lesions, sore throat.  GI: see HPI.  Heme/Lymph: No easy bruising.  CV: No chest pain.  GU: No hematuria.  Integumentary: No rashes.  Neuro: No headaches.  Psych: No depression/anxiety.  Endocrine: No heat/cold intolerance.  Allergic/Immunologic: No urticaria.  Resp: No cough, SOB.  Musculoskeletal: No joint swelling.  Physical Examination: BP (!) 150/56 (BP Location: Right Arm)   Pulse 80   Temp 97.9 F (36.6 C)   Resp 17   Ht 5' 8 (1.727 m)   Wt 104.3 kg   SpO2 100%   BMI 34.96 kg/m  Gen: NAD, alert and oriented x 4 HEENT: PEERLA, EOMI, Neck: supple, no JVD or thyromegaly Chest: CTA bilaterally, no wheezes, crackles, or other adventitious sounds CV: RRR, no m/g/c/r Abd: soft, NT, ND, +BS in all four quadrants; no HSM, guarding, ridigity, or rebound tenderness Ext: no edema, well perfused with 2+ pulses, Skin: no rash or lesions noted Lymph: no LAD  Data: Lab Results  Component Value Date   WBC 4.8 02/04/2024   HGB 8.5 (L) 02/04/2024   HCT 26.2 (L) 02/04/2024   MCV 91.0 02/04/2024   PLT 233 02/04/2024   Recent Labs  Lab 02/03/24 1010  02/03/24 1621 02/04/24 0455  HGB 7.1* 9.4* 8.5*   Lab Results  Component Value Date   NA 138 02/04/2024   K 3.5 02/04/2024   CL 102 02/04/2024   CO2 25 02/04/2024   BUN 21 02/04/2024   CREATININE 1.35 (H) 02/04/2024   Lab Results  Component Value Date   ALT 10 02/01/2024   AST 15 02/01/2024   ALKPHOS 96 02/01/2024   BILITOT 0.3 02/01/2024   No results for input(s): APTT, INR, PTT in the last 168 hours.  Assessment/Plan:  79 y/o Caucasian female with a PMH of obesity (BMI 34), HTN, HLD, CKD Stage IIIa, GERD, hx of UGIB 2/2 PUD, osteopenia, CAD, hx of BCC s/p excision, and GAD presented to the [facility] ED 1/6 for chief complaint of generalized weakness and dark stools. She is s/p inpatient EGD 1/7 which commented on medium-sized hiatal hernia, distal esophageal stricture (not dilated), few non-bleeding linear gastric ulcers with no stigmata of bleeding found in gastric body, and normal duodenum. GI asked to see again due to 1-gram drop in hemoglobin overnight with no overt signs of GI bleeding.   Peptic ulcer disease - s/p EGD 1/7 two days ago which showed few non-bleeding linear gastric ulcers in gastric body  Acute blood loss anemia  History of GI Bleed 2/2 PUD  Distal esophageal stricture - asymptomatic   Hiatal hernia   Recommendations:  - Clinically, patient doing well with no overt signs of GI bleeding - OK to discharge this afternoon from a GI standpoint. - Defer inpatient colonoscopy +/- capsule study - Will arrange outpatient GI follow-up to discuss CSY +/- VCE - She should continue Protonix  40 mg once daily for gastric protection - Avoid NSAIDs - Follow-up with PCP in 1-2 weeks upon discharge for CBC re-check  Thank you for the consult. Please call with questions or concerns.  Twyla CHRISTELLA Primmer, PA-C [facility] [phone]     [1]  Allergies Allergen Reactions   Amlodipine Other (See Comments)    Brain fog, confusion    Demerol [Meperidine] Itching   Hydrochlorothiazide  Other (See Comments)    Hyponatermia   Nsaids Other (See Comments)    GI bleeding   Codeine Itching   "

## 2024-02-10 ENCOUNTER — Ambulatory Visit
Admission: RE | Admit: 2024-02-10 | Discharge: 2024-02-10 | Disposition: A | Discharge status: Home / Self Care | Source: Ambulatory Visit | Attending: Nurse Practitioner | Admitting: Nurse Practitioner

## 2024-02-10 VITALS — BP 148/58 | HR 68 | Temp 97.6°F | Resp 18

## 2024-02-10 DIAGNOSIS — D500 Iron deficiency anemia secondary to blood loss (chronic): Secondary | ICD-10-CM | POA: Insufficient documentation

## 2024-02-10 DIAGNOSIS — K922 Gastrointestinal hemorrhage, unspecified: Secondary | ICD-10-CM | POA: Insufficient documentation

## 2024-02-10 MED ORDER — IRON SUCROSE 300 MG IVPB - SIMPLE MED
300.0000 mg | Freq: Once | Status: AC
  Administered 2024-02-10: 300 mg via INTRAVENOUS
  Filled 2024-02-10: qty 300

## 2024-02-17 ENCOUNTER — Ambulatory Visit
Admission: RE | Admit: 2024-02-17 | Discharge: 2024-02-17 | Disposition: A | Discharge status: Home / Self Care | Source: Ambulatory Visit | Attending: Nephrology | Admitting: Nephrology

## 2024-02-17 VITALS — BP 143/54 | HR 63 | Temp 97.1°F | Resp 17

## 2024-02-17 DIAGNOSIS — K922 Gastrointestinal hemorrhage, unspecified: Secondary | ICD-10-CM | POA: Insufficient documentation

## 2024-02-17 DIAGNOSIS — D500 Iron deficiency anemia secondary to blood loss (chronic): Secondary | ICD-10-CM | POA: Diagnosis present

## 2024-02-17 MED ORDER — IRON SUCROSE 300 MG IVPB - SIMPLE MED
300.0000 mg | Freq: Once | Status: AC
  Administered 2024-02-17: 300 mg via INTRAVENOUS
  Filled 2024-02-17: qty 300
